# Patient Record
Sex: Male | Born: 1970 | Race: Black or African American | Hispanic: No | Marital: Single | Smoking: Current some day smoker
Health system: Southern US, Community
[De-identification: ages and names within clinical notes are randomized; demographics above are authoritative.]

## PROBLEM LIST (undated history)

## (undated) DIAGNOSIS — I10 Essential (primary) hypertension: Secondary | ICD-10-CM

## (undated) HISTORY — PX: GASTRIC BYPASS: SHX52

## (undated) HISTORY — PX: CHOLECYSTECTOMY: SHX55

---

## 2017-11-20 ENCOUNTER — Emergency Department (HOSPITAL_COMMUNITY)
Admission: EM | Admit: 2017-11-20 | Discharge: 2017-11-20 | Disposition: A | Discharge status: Home / Self Care | Payer: BLUE CROSS/BLUE SHIELD | Attending: Emergency Medicine | Admitting: Emergency Medicine

## 2017-11-20 ENCOUNTER — Emergency Department (HOSPITAL_COMMUNITY): Payer: BLUE CROSS/BLUE SHIELD

## 2017-11-20 ENCOUNTER — Encounter (HOSPITAL_COMMUNITY): Payer: Self-pay | Admitting: Emergency Medicine

## 2017-11-20 DIAGNOSIS — F17210 Nicotine dependence, cigarettes, uncomplicated: Secondary | ICD-10-CM | POA: Diagnosis not present

## 2017-11-20 DIAGNOSIS — I10 Essential (primary) hypertension: Secondary | ICD-10-CM | POA: Diagnosis not present

## 2017-11-20 DIAGNOSIS — F329 Major depressive disorder, single episode, unspecified: Secondary | ICD-10-CM | POA: Insufficient documentation

## 2017-11-20 DIAGNOSIS — F10239 Alcohol dependence with withdrawal, unspecified: Secondary | ICD-10-CM | POA: Insufficient documentation

## 2017-11-20 DIAGNOSIS — F32A Depression, unspecified: Secondary | ICD-10-CM

## 2017-11-20 DIAGNOSIS — F10230 Alcohol dependence with withdrawal, uncomplicated: Secondary | ICD-10-CM

## 2017-11-20 DIAGNOSIS — R0602 Shortness of breath: Secondary | ICD-10-CM | POA: Diagnosis present

## 2017-11-20 DIAGNOSIS — F10930 Alcohol use, unspecified with withdrawal, uncomplicated: Secondary | ICD-10-CM

## 2017-11-20 HISTORY — DX: Essential (primary) hypertension: I10

## 2017-11-20 LAB — RAPID URINE DRUG SCREEN, HOSP PERFORMED
AMPHETAMINES: NOT DETECTED
BENZODIAZEPINES: NOT DETECTED
Barbiturates: NOT DETECTED
COCAINE: NOT DETECTED
OPIATES: NOT DETECTED
Tetrahydrocannabinol: NOT DETECTED

## 2017-11-20 LAB — CBC
HEMATOCRIT: 44.5 % (ref 39.0–52.0)
HEMOGLOBIN: 14.5 g/dL (ref 13.0–17.0)
MCH: 28.5 pg (ref 26.0–34.0)
MCHC: 32.6 g/dL (ref 30.0–36.0)
MCV: 87.6 fL (ref 78.0–100.0)
Platelets: 199 10*3/uL (ref 150–400)
RBC: 5.08 MIL/uL (ref 4.22–5.81)
RDW: 14.1 % (ref 11.5–15.5)
WBC: 2.8 10*3/uL — ABNORMAL LOW (ref 4.0–10.5)

## 2017-11-20 LAB — BASIC METABOLIC PANEL
ANION GAP: 26 — AB (ref 5–15)
BUN: 13 mg/dL (ref 6–20)
CO2: 15 mmol/L — ABNORMAL LOW (ref 22–32)
Calcium: 9 mg/dL (ref 8.9–10.3)
Chloride: 98 mmol/L (ref 98–111)
Creatinine, Ser: 0.94 mg/dL (ref 0.61–1.24)
GLUCOSE: 74 mg/dL (ref 70–99)
POTASSIUM: 3.8 mmol/L (ref 3.5–5.1)
Sodium: 139 mmol/L (ref 135–145)

## 2017-11-20 LAB — I-STAT TROPONIN, ED: Troponin i, poc: 0 ng/mL (ref 0.00–0.08)

## 2017-11-20 MED ORDER — LORAZEPAM 1 MG PO TABS
2.0000 mg | ORAL_TABLET | Freq: Once | ORAL | Status: AC
  Administered 2017-11-20: 2 mg via ORAL
  Filled 2017-11-20: qty 2

## 2017-11-20 MED ORDER — LORAZEPAM 1 MG PO TABS: 1.0000 mg | ORAL_TABLET | Freq: Four times a day (QID) | ORAL | 0 refills | Status: DC | PRN

## 2017-11-20 MED ORDER — SODIUM CHLORIDE 0.9 % IV BOLUS
1000.0000 mL | Freq: Once | INTRAVENOUS | Status: AC
  Administered 2017-11-20: 1000 mL via INTRAVENOUS

## 2017-11-20 NOTE — ED Triage Notes (Signed)
Pt presents with c/o CP and SOB since this morning; pt also withdrawing from ETOH voluntarily, last drink last PM; pt reports 3 pints liquor daily

## 2017-11-20 NOTE — ED Provider Notes (Signed)
Patient placed in Quick Look pathway, seen and evaluated   Chief Complaint: alcohol withdrawal  HPI:  Pt presents with c/o CP and SOB since this morning; pt also withdrawing from ETOH voluntarily, last drink last PM; pt reports 3 pints liquor daily   ROS: CV: chest pain  Resp: shortness of breath  Neuro: shakey  Physical Exam:  BP (!) 142/102   Pulse (!) 119   Temp 99.2 F (37.3 C) (Oral)   Resp 20   Ht 5\' 10"  (1.778 m)   Wt 86.2 kg   SpO2 98%   BMI 27.26 kg/m    Gen: No distress  Neuro: not speaking, hands are shaking  Heart: tachycardia    Initiation of care has begun. The patient has been counseled on the process, plan, and necessity for staying for the completion/evaluation, and the remainder of the medical screening examination    Janne Napoleon, NP 11/20/17 Delrae Alfred, MD 11/20/17 [id number]
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
## 2017-11-20 NOTE — ED Notes (Signed)
ED Provider at bedside. 

## 2017-11-20 NOTE — Discharge Instructions (Signed)
Continue to avoid all forms of alcohol and consider going to Alcoholics Anonymous.  We are prescribing a medication that can help you avoid the symptoms of alcohol withdrawal lorazepam.  Return here if needed for problems.

## 2017-11-20 NOTE — ED Notes (Signed)
Pt confirmed family is able to take him home

## 2018-01-14 ENCOUNTER — Other Ambulatory Visit: Payer: Self-pay

## 2018-01-14 ENCOUNTER — Observation Stay (HOSPITAL_COMMUNITY)
Admission: EM | Admit: 2018-01-14 | Discharge: 2018-01-16 | Disposition: A | Discharge status: Home / Self Care | Payer: BLUE CROSS/BLUE SHIELD | Attending: Family Medicine | Admitting: Family Medicine

## 2018-01-14 ENCOUNTER — Encounter (HOSPITAL_COMMUNITY): Payer: Self-pay

## 2018-01-14 DIAGNOSIS — F10232 Alcohol dependence with withdrawal with perceptual disturbance: Principal | ICD-10-CM | POA: Insufficient documentation

## 2018-01-14 DIAGNOSIS — E876 Hypokalemia: Secondary | ICD-10-CM

## 2018-01-14 DIAGNOSIS — D72819 Decreased white blood cell count, unspecified: Secondary | ICD-10-CM | POA: Insufficient documentation

## 2018-01-14 DIAGNOSIS — K760 Fatty (change of) liver, not elsewhere classified: Secondary | ICD-10-CM | POA: Insufficient documentation

## 2018-01-14 DIAGNOSIS — F10231 Alcohol dependence with withdrawal delirium: Secondary | ICD-10-CM | POA: Diagnosis not present

## 2018-01-14 DIAGNOSIS — F10239 Alcohol dependence with withdrawal, unspecified: Secondary | ICD-10-CM | POA: Diagnosis present

## 2018-01-14 DIAGNOSIS — K219 Gastro-esophageal reflux disease without esophagitis: Secondary | ICD-10-CM | POA: Insufficient documentation

## 2018-01-14 DIAGNOSIS — Z79899 Other long term (current) drug therapy: Secondary | ICD-10-CM | POA: Insufficient documentation

## 2018-01-14 DIAGNOSIS — F17290 Nicotine dependence, other tobacco product, uncomplicated: Secondary | ICD-10-CM | POA: Insufficient documentation

## 2018-01-14 DIAGNOSIS — F17200 Nicotine dependence, unspecified, uncomplicated: Secondary | ICD-10-CM | POA: Diagnosis not present

## 2018-01-14 DIAGNOSIS — Z9109 Other allergy status, other than to drugs and biological substances: Secondary | ICD-10-CM | POA: Insufficient documentation

## 2018-01-14 DIAGNOSIS — I10 Essential (primary) hypertension: Secondary | ICD-10-CM

## 2018-01-14 DIAGNOSIS — Z9049 Acquired absence of other specified parts of digestive tract: Secondary | ICD-10-CM | POA: Insufficient documentation

## 2018-01-14 DIAGNOSIS — Z9884 Bariatric surgery status: Secondary | ICD-10-CM | POA: Diagnosis not present

## 2018-01-14 DIAGNOSIS — R748 Abnormal levels of other serum enzymes: Secondary | ICD-10-CM | POA: Diagnosis present

## 2018-01-14 DIAGNOSIS — D649 Anemia, unspecified: Secondary | ICD-10-CM | POA: Insufficient documentation

## 2018-01-14 DIAGNOSIS — K5900 Constipation, unspecified: Secondary | ICD-10-CM | POA: Insufficient documentation

## 2018-01-14 DIAGNOSIS — F419 Anxiety disorder, unspecified: Secondary | ICD-10-CM | POA: Diagnosis not present

## 2018-01-14 DIAGNOSIS — R7989 Other specified abnormal findings of blood chemistry: Secondary | ICD-10-CM

## 2018-01-14 DIAGNOSIS — F10939 Alcohol use, unspecified with withdrawal, unspecified: Secondary | ICD-10-CM | POA: Diagnosis present

## 2018-01-14 DIAGNOSIS — Z885 Allergy status to narcotic agent status: Secondary | ICD-10-CM | POA: Insufficient documentation

## 2018-01-14 DIAGNOSIS — K921 Melena: Secondary | ICD-10-CM | POA: Diagnosis not present

## 2018-01-14 DIAGNOSIS — R945 Abnormal results of liver function studies: Secondary | ICD-10-CM

## 2018-01-14 DIAGNOSIS — F10932 Alcohol use, unspecified with withdrawal with perceptual disturbance: Secondary | ICD-10-CM

## 2018-01-14 LAB — URINALYSIS, ROUTINE W REFLEX MICROSCOPIC
Bacteria, UA: NONE SEEN
Glucose, UA: NEGATIVE mg/dL
Hgb urine dipstick: NEGATIVE
KETONES UR: 80 mg/dL — AB
LEUKOCYTES UA: NEGATIVE
Nitrite: NEGATIVE
PH: 5 (ref 5.0–8.0)
Protein, ur: 100 mg/dL — AB
SPECIFIC GRAVITY, URINE: 1.033 — AB (ref 1.005–1.030)

## 2018-01-14 LAB — COMPREHENSIVE METABOLIC PANEL
ALBUMIN: 4.8 g/dL (ref 3.5–5.0)
ALT: 118 U/L — AB (ref 0–44)
AST: 188 U/L — ABNORMAL HIGH (ref 15–41)
Alkaline Phosphatase: 52 U/L (ref 38–126)
Anion gap: 13 (ref 5–15)
BUN: 11 mg/dL (ref 6–20)
CHLORIDE: 99 mmol/L (ref 98–111)
CO2: 27 mmol/L (ref 22–32)
Calcium: 9.4 mg/dL (ref 8.9–10.3)
Creatinine, Ser: 0.77 mg/dL (ref 0.61–1.24)
GFR calc Af Amer: >60 mL/min (ref >60)
GFR calc non Af Amer: >60 mL/min (ref >60)
GLUCOSE: 101 mg/dL — AB (ref 70–99)
POTASSIUM: 3.2 mmol/L — AB (ref 3.5–5.1)
Sodium: 139 mmol/L (ref 135–145)
Total Bilirubin: 5.6 mg/dL — ABNORMAL HIGH (ref 0.3–1.2)
Total Protein: 8.1 g/dL (ref 6.5–8.1)

## 2018-01-14 LAB — CBC
HEMATOCRIT: 41.2 % (ref 39.0–52.0)
HEMOGLOBIN: 13.6 g/dL (ref 13.0–17.0)
MCH: 29.1 pg (ref 26.0–34.0)
MCHC: 33 g/dL (ref 30.0–36.0)
MCV: 88.2 fL (ref 80.0–100.0)
Platelets: 134 10*3/uL — ABNORMAL LOW (ref 150–400)
RBC: 4.67 MIL/uL (ref 4.22–5.81)
RDW: 14.6 % (ref 11.5–15.5)
WBC: 2.6 10*3/uL — ABNORMAL LOW (ref 4.0–10.5)
nRBC: 0 % (ref 0.0–0.2)

## 2018-01-14 LAB — RAPID URINE DRUG SCREEN, HOSP PERFORMED
AMPHETAMINES: NOT DETECTED
BARBITURATES: NOT DETECTED
Benzodiazepines: NOT DETECTED
Cocaine: NOT DETECTED
Opiates: NOT DETECTED
TETRAHYDROCANNABINOL: NOT DETECTED

## 2018-01-14 LAB — APTT: aPTT: 30 seconds (ref 24–36)

## 2018-01-14 LAB — AMMONIA: Ammonia: 29 umol/L (ref 9–35)

## 2018-01-14 LAB — MRSA PCR SCREENING: MRSA BY PCR: NEGATIVE

## 2018-01-14 LAB — ETHANOL: Alcohol, Ethyl (B): <10 mg/dL (ref <10)

## 2018-01-14 LAB — PROTIME-INR
INR: 1.04
PROTHROMBIN TIME: 13.5 s (ref 11.4–15.2)

## 2018-01-14 LAB — LIPASE, BLOOD: LIPASE: 34 U/L (ref 11–51)

## 2018-01-14 MED ORDER — AMLODIPINE BESYLATE 10 MG PO TABS
10.0000 mg | ORAL_TABLET | Freq: Every day | ORAL | Status: DC
  Administered 2018-01-15 – 2018-01-16 (×2): 10 mg via ORAL
  Filled 2018-01-14 (×2): qty 1

## 2018-01-14 MED ORDER — DOCUSATE SODIUM 100 MG PO CAPS
100.0000 mg | ORAL_CAPSULE | Freq: Two times a day (BID) | ORAL | Status: DC
  Administered 2018-01-15 – 2018-01-16 (×2): 100 mg via ORAL
  Filled 2018-01-14 (×2): qty 1

## 2018-01-14 MED ORDER — SODIUM CHLORIDE 0.9 % IV BOLUS
1000.0000 mL | Freq: Once | INTRAVENOUS | Status: AC
  Administered 2018-01-14: 1000 mL via INTRAVENOUS

## 2018-01-14 MED ORDER — LORAZEPAM 2 MG/ML IJ SOLN
1.0000 mg | Freq: Once | INTRAMUSCULAR | Status: AC
  Administered 2018-01-14: 1 mg via INTRAVENOUS
  Filled 2018-01-14: qty 1

## 2018-01-14 MED ORDER — SODIUM CHLORIDE 0.9 % IV SOLN
INTRAVENOUS | Status: DC
  Administered 2018-01-14 – 2018-01-16 (×4): via INTRAVENOUS

## 2018-01-14 MED ORDER — THIAMINE HCL 100 MG/ML IJ SOLN
100.0000 mg | Freq: Every day | INTRAMUSCULAR | Status: DC
  Administered 2018-01-14 – 2018-01-16 (×3): 100 mg via INTRAVENOUS
  Filled 2018-01-14 (×3): qty 2

## 2018-01-14 MED ORDER — ONDANSETRON HCL 4 MG/2ML IJ SOLN: 4.0000 mg | Freq: Four times a day (QID) | INTRAMUSCULAR | Status: DC | PRN

## 2018-01-14 MED ORDER — ONDANSETRON HCL 4 MG/2ML IJ SOLN
4.0000 mg | Freq: Once | INTRAMUSCULAR | Status: AC
  Administered 2018-01-14: 4 mg via INTRAVENOUS
  Filled 2018-01-14: qty 2

## 2018-01-14 MED ORDER — LORAZEPAM 2 MG/ML IJ SOLN
1.0000 mg | Freq: Once | INTRAMUSCULAR | Status: AC
  Administered 2018-01-14: 1 mg via INTRAVENOUS

## 2018-01-14 MED ORDER — THIAMINE HCL 100 MG/ML IJ SOLN
100.0000 mg | Freq: Once | INTRAMUSCULAR | Status: AC
  Administered 2018-01-14: 100 mg via INTRAVENOUS
  Filled 2018-01-14: qty 2

## 2018-01-14 MED ORDER — FOLIC ACID 1 MG PO TABS
1.0000 mg | ORAL_TABLET | Freq: Every day | ORAL | Status: DC
  Administered 2018-01-14 – 2018-01-16 (×3): 1 mg via ORAL
  Filled 2018-01-14 (×3): qty 1

## 2018-01-14 MED ORDER — POTASSIUM CHLORIDE CRYS ER 20 MEQ PO TBCR
40.0000 meq | EXTENDED_RELEASE_TABLET | Freq: Once | ORAL | Status: AC
  Administered 2018-01-14: 40 meq via ORAL
  Filled 2018-01-14: qty 2

## 2018-01-14 MED ORDER — ADULT MULTIVITAMIN W/MINERALS CH
1.0000 | ORAL_TABLET | Freq: Once | ORAL | Status: AC
  Administered 2018-01-14: 1 via ORAL
  Filled 2018-01-14: qty 1

## 2018-01-14 MED ORDER — HALOPERIDOL LACTATE 5 MG/ML IJ SOLN
5.0000 mg | Freq: Once | INTRAMUSCULAR | Status: AC
  Administered 2018-01-14: 5 mg via INTRAVENOUS
  Filled 2018-01-14: qty 1

## 2018-01-14 MED ORDER — ONDANSETRON HCL 4 MG PO TABS: 4.0000 mg | ORAL_TABLET | Freq: Four times a day (QID) | ORAL | Status: DC | PRN

## 2018-01-14 MED ORDER — LORAZEPAM 2 MG/ML IJ SOLN
2.0000 mg | INTRAMUSCULAR | Status: DC | PRN
  Administered 2018-01-14: 3 mg via INTRAVENOUS
  Administered 2018-01-14: 2 mg via INTRAVENOUS
  Administered 2018-01-14 – 2018-01-15 (×2): 3 mg via INTRAVENOUS
  Administered 2018-01-16: 2 mg via INTRAVENOUS
  Filled 2018-01-14: qty 1
  Filled 2018-01-14: qty 2
  Filled 2018-01-14: qty 1
  Filled 2018-01-14 (×2): qty 2

## 2018-01-14 MED ORDER — ENSURE ENLIVE PO LIQD
237.0000 mL | Freq: Two times a day (BID) | ORAL | Status: DC
  Administered 2018-01-15 – 2018-01-16 (×3): 237 mL via ORAL

## 2018-01-14 MED ORDER — MUSCLE RUB 10-15 % EX CREA
1.0000 "application " | TOPICAL_CREAM | CUTANEOUS | Status: DC | PRN
  Filled 2018-01-14: qty 85

## 2018-01-14 NOTE — ED Notes (Signed)
Pt given food per order.

## 2018-01-14 NOTE — H&P (Addendum)
History and Physical  [name] ZOX:[id number] DOB: 1970-12-23 DOA: 01/14/2018  PCP: Patient, No Pcp Per Patient coming from: Home  I have personally briefly reviewed patient's old medical records in [facility]   Chief Complaint: Tremors, hallucination.  HPI: [name] is a 47 y.o. male with past medical history significant for hypertension, prior cholecystectomy, alcohol abuse who presents to the emergency department complaining of tremors, anxiety, visible on hallucinatory elucidation.  Reports that his symptoms are started on Saturday, 3 days prior to admission after he stopped drinking alcohol.  Last alcohol intake was Friday.  He has been drinking pint of alcohol every day for the last week.  Weeks prior he used to drink every other day, a pint.  He also reports a sensation of something crawling in his bed.  He is also complaining of abdominal cramps.  So bad that he has to stand up on a stretcher himself.  He denies nausea or vomiting. Denies chest pain, shortness of breath, cough.   Review of Systems: All systems reviewed and apart from history of presenting illness, are negative.  Past Medical History:  Diagnosis Date  . Hypertension    Past Surgical History:  Procedure Laterality Date  . CHOLECYSTECTOMY    . GASTRIC BYPASS     Social History:  reports that he has been smoking. He does not have any smokeless tobacco history on file. He reports that he drinks alcohol. He reports that he has current or past drug history.  He drinks a pint daily.   Allergies  Allergen Reactions  . Oxycodone Anaphylaxis  . Pollen Extract Shortness Of Breath, Itching and Other (See Comments)    Runny nose, itchy eyes, sniffles   Family history; his mother died of leukemia at age 20.  His father died in a motor vehicle accident at age 93  Prior to Admission medications   Medication Sig Start Date End Date Taking? Authorizing Provider  acetaminophen (TYLENOL) 325 MG  tablet Take 650 mg by mouth every 6 (six) hours as needed for mild pain or headache.   Yes [provider]  amLODipine (NORVASC) 10 MG tablet Take 10 mg by mouth daily.   Yes [provider]  ibuprofen (ADVIL,MOTRIN) 200 MG tablet Take 600-800 mg by mouth every 6 (six) hours as needed for headache or mild pain.   Yes [provider]  Menthol-Methyl Salicylate (MUSCLE RUB) 10-15 % CREA Apply 1 application topically as needed for muscle pain.   Yes [provider]  LORazepam (ATIVAN) 1 MG tablet Take 1 tablet (1 mg total) by mouth every 6 (six) hours as needed (Alcohol withdrawal symptoms). Patient not taking: Reported on 01/14/2018 11/20/17   Mancel Bale, MD   Physical Exam: Vitals:   01/14/18 1459 01/14/18 1500 01/14/18 1600 01/14/18 1700  BP: (!) 137/97 (!) 144/99 119/86 (!) 132/91  Pulse: (!) 106 (!) 103 (!) 109 (!) 120  Resp:  19 19 16   Temp:      TempSrc:      SpO2:  98% 96% 100%  Weight:      Height:         General exam: Moderately built and nourished patient, lying comfortably supine on the gurney in no obvious distress.  Anxious  Head, eyes and ENT: Nontraumatic and normocephalic. Pupils equally reacting to light and accommodation. Oral mucosa moist.  Neck: Supple. No JVD, carotid bruit or thyromegaly.  Lymphatics: No lymphadenopathy.  Respiratory system: Clear to auscultation.  No increased work of breathing.  Cardiovascular system: S1 and S2 heard, regular, tachycardic. No JVD, murmurs, gallops, clicks or pedal edema.  Gastrointestinal system: Abdomen is mild distended, soft and nontender. Normal bowel sounds heard.   Central nervous system: Alert and oriented. No focal neurological deficits.  Hands tremors  Extremities: Symmetric 5 x 5 power. Peripheral pulses symmetrically felt.   Skin: No rashes or acute findings.  Musculoskeletal system: Negative exam.  Psychiatry: Pleasant and cooperative.  He report visual and auditory  hallucination   Labs on Admission:  Basic Metabolic Panel: Recent Labs  Lab 01/14/18 1500  NA 139  K 3.2*  CL 99  CO2 27  GLUCOSE 101*  BUN 11  CREATININE 0.77  CALCIUM 9.4   Liver Function Tests: Recent Labs  Lab 01/14/18 1500  AST 188*  ALT 118*  ALKPHOS 52  BILITOT 5.6*  PROT 8.1  ALBUMIN 4.8   Recent Labs  Lab 01/14/18 1500  LIPASE 34   Recent Labs  Lab 01/14/18 1522  AMMONIA 29   CBC: Recent Labs  Lab 01/14/18 1500  WBC 2.6*  HGB 13.6  HCT 41.2  MCV 88.2  PLT 134*   Cardiac Enzymes: No results for input(s): CKTOTAL, CKMB, CKMBINDEX, TROPONINI in the last 168 hours.  BNP (last 3 results) No results for input(s): PROBNP in the last 8760 hours. CBG: No results for input(s): GLUCAP in the last 168 hours.  Radiological Exams on Admission: No results found.  EKG: No EKG available with ordered.  Assessment/Plan Principal Problem:   Alcohol withdrawal (HCC) Active Problems:   Abnormal transaminases   Essential hypertension   Hypokalemia   Leukopenia   #1-Alcohol withdrawal; concern for delirium tremens. Presents with confusion, hallucination, tremors. IV fluids. We will start CIWA protocol, Ativan as needed. Need to the stepdown unit for closer monitoring. Will start thiamine, folate.  #2-transaminases, hiperbilirubinemia; In the setting of alcohol abuse. No prior labs to compare. Check right upper quadrant ultrasound. Prior history of cholecystectomy. Repeat liver function test in the morning. Will need to get PTT, INR.  To calculate DF score to consider steroids.  #3-leukopenia; Suspect  related to alcohol abuse. Repeat labs in the morning.  #3 hypokalemia Repleted orally  #4-thrombocytopenia Setting of alcohol abuse. Follow trend.  #5-tachycardia;  Elated to alcohol withdrawal. EKG. IV fluids.  #6-hypertension; Continue with Norvasc.  DVT Prophylaxis: SCDs Code Status: Full code Family Communication: No family  at bedside. Disposition Plan: Admitted to the stepdown unit for alcohol withdrawal.  Time spent: 75 minutes    Alba CoryBelkys A Bentleigh Stankus MD [facility] Pager [phone]  If 7PM-7AM, please contact night-coverage [email or web address] Password [email or web address]  01/14/2018, 5:42 PM
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
## 2018-01-14 NOTE — ED Triage Notes (Signed)
Patient BIB EMS with complaints of ETOH withdrawal symptoms. Patient usually drinks hard liquor heavily x8-10 years. Patient reports his last drink was 2 days ago. Patient reports he recently moved to [place] to live with his daughter and just started a new job. Patient [place] detox community support options in [place]. Patient reports nausea/vomiting/ cold sweats/hot flashes/ night terrors/ auditory hallucinations. Patient denies SI/HI.

## 2018-01-15 ENCOUNTER — Encounter (HOSPITAL_COMMUNITY): Payer: Self-pay

## 2018-01-15 ENCOUNTER — Observation Stay (HOSPITAL_COMMUNITY): Payer: BLUE CROSS/BLUE SHIELD

## 2018-01-15 DIAGNOSIS — I10 Essential (primary) hypertension: Secondary | ICD-10-CM | POA: Diagnosis not present

## 2018-01-15 DIAGNOSIS — E876 Hypokalemia: Secondary | ICD-10-CM | POA: Diagnosis not present

## 2018-01-15 DIAGNOSIS — F10231 Alcohol dependence with withdrawal delirium: Secondary | ICD-10-CM | POA: Diagnosis not present

## 2018-01-15 DIAGNOSIS — R748 Abnormal levels of other serum enzymes: Secondary | ICD-10-CM | POA: Diagnosis not present

## 2018-01-15 LAB — CBC
HCT: 36.7 % — ABNORMAL LOW (ref 39.0–52.0)
HEMATOCRIT: 35.3 % — AB (ref 39.0–52.0)
HEMOGLOBIN: 11.8 g/dL — AB (ref 13.0–17.0)
Hemoglobin: 11.5 g/dL — ABNORMAL LOW (ref 13.0–17.0)
MCH: 29.5 pg (ref 26.0–34.0)
MCH: 29.3 pg (ref 26.0–34.0)
MCHC: 32.6 g/dL (ref 30.0–36.0)
MCHC: 32.2 g/dL (ref 30.0–36.0)
MCV: 90.5 fL (ref 80.0–100.0)
MCV: 91.1 fL (ref 80.0–100.0)
NRBC: 0 % (ref 0.0–0.2)
PLATELETS: 99 10*3/uL — AB (ref 150–400)
Platelets: 104 10*3/uL — ABNORMAL LOW (ref 150–400)
RBC: 3.9 MIL/uL — AB (ref 4.22–5.81)
RBC: 4.03 MIL/uL — ABNORMAL LOW (ref 4.22–5.81)
RDW: 14.6 % (ref 11.5–15.5)
RDW: 14.3 % (ref 11.5–15.5)
WBC: 2 10*3/uL — AB (ref 4.0–10.5)
WBC: 2.4 10*3/uL — AB (ref 4.0–10.5)
nRBC: 0 % (ref 0.0–0.2)

## 2018-01-15 LAB — COMPREHENSIVE METABOLIC PANEL
ALT: 135 U/L — AB (ref 0–44)
AST: 220 U/L — ABNORMAL HIGH (ref 15–41)
Albumin: 3.9 g/dL (ref 3.5–5.0)
Alkaline Phosphatase: 41 U/L (ref 38–126)
Anion gap: 7 (ref 5–15)
BUN: 6 mg/dL (ref 6–20)
CHLORIDE: 106 mmol/L (ref 98–111)
CO2: 28 mmol/L (ref 22–32)
CREATININE: 0.63 mg/dL (ref 0.61–1.24)
Calcium: 8.6 mg/dL — ABNORMAL LOW (ref 8.9–10.3)
Glucose, Bld: 84 mg/dL (ref 70–99)
Potassium: 3.4 mmol/L — ABNORMAL LOW (ref 3.5–5.1)
SODIUM: 141 mmol/L (ref 135–145)
Total Bilirubin: 3.7 mg/dL — ABNORMAL HIGH (ref 0.3–1.2)
Total Protein: 6.5 g/dL (ref 6.5–8.1)

## 2018-01-15 LAB — PROTIME-INR
INR: 1.02
Prothrombin Time: 13.3 seconds (ref 11.4–15.2)

## 2018-01-15 LAB — HIV ANTIBODY (ROUTINE TESTING W REFLEX): HIV SCREEN 4TH GENERATION: NONREACTIVE

## 2018-01-15 MED ORDER — POTASSIUM CHLORIDE CRYS ER 20 MEQ PO TBCR
40.0000 meq | EXTENDED_RELEASE_TABLET | Freq: Once | ORAL | Status: AC
  Administered 2018-01-15: 40 meq via ORAL
  Filled 2018-01-15: qty 2

## 2018-01-15 MED ORDER — HYDRALAZINE HCL 25 MG PO TABS: 25.0000 mg | ORAL_TABLET | Freq: Four times a day (QID) | ORAL | Status: DC | PRN

## 2018-01-15 NOTE — Progress Notes (Signed)
   01/15/18 1200  Clinical Encounter Type  Visited With Patient  Visit Type Initial;Psychological support;Spiritual support;Critical Care  Referral From Nurse  Consult/Referral To Chaplain  Spiritual Encounters  Spiritual Needs Other (Comment) (Advance Directive )  Stress Factors  Patient Stress Factors Health changes;Major life changes  Advance Directives (For Healthcare)  Does Patient Have a Medical Advance Directive? No  Would patient like information on creating a medical advance directive? No - Patient declined (Not at this time )   Provided patient with Advance Directive paperwork. He does not want to complete at this time. Main concern is being discharged for Thanksgiving.

## 2018-01-15 NOTE — Progress Notes (Signed)
[facility]  PROGRESS NOTE  [name] WGN:[id number] DOB: 11/10/1970 DOA: 01/14/2018 PCP: Patient, No Pcp Per   Brief HPI:   47 year old male with a history of hypertension, prior cholecystectomy, alcohol abuse came to ED with complaints of tremors, anxiety hallucinations.  Patient had stopped drinking alcohol 3 days prior to admission.  Patient admitted for alcohol withdrawal.    Subjective   Patient seen and examined still having tremors.  Required 8 mg of Ativan last night along with 5 mg of Haldol for agitation and confusion.   Assessment/Plan:     1. Alcohol withdrawal-patient is at risk for DTs.  Continue CIWA protocol.  Patient received 8 mg of Ativan last night along with 5 mg of Haldol.  Continue to monitor in stepdown unit.  2. Elevated LFTs-patient has mild elevation of LFTs and bilirubin.  Likely in setting of alcohol abuse.  He has had a history of prior cholecystectomy.  LFTs are stable at this time.  Check CMP in a.m.  3. Leukopenia-WBC is 2.0 which is down from 2.6 yesterday.  Platelet count is also down to 99,000.  Will follow CBC in a.m.  Likely in setting of alcohol abuse.  4. Hypertension-blood pressure stable, continue Norvasc.  Also continue with hydralazine 25 mg p.o. every 6 hours PRN for BP greater than 160/100.  5. Hypokalemia-potassium is 3.4, will replace potassium and check BMP in am.     CBG: No results for input(s): GLUCAP in the last 168 hours.  CBC: Recent Labs  Lab 01/14/18 1500 01/15/18 0322  WBC 2.6* 2.0*  HGB 13.6 11.5*  HCT 41.2 35.3*  MCV 88.2 90.5  PLT 134* 99*    Basic Metabolic Panel: Recent Labs  Lab 01/14/18 1500 01/15/18 0322  NA 139 141  K 3.2* 3.4*  CL 99 106  CO2 27 28  GLUCOSE 101* 84  BUN 11 6  CREATININE 0.77 0.63  CALCIUM 9.4 8.6*     DVT prophylaxis: SCDs  Code Status: Full code  Family Communication: No family at bedside  Disposition Plan: likely home when medically ready for  discharge   Consultants:    Procedures:     Antibiotics:   Anti-infectives (From admission, onward)   None       Objective   Vitals:   01/15/18 1000 01/15/18 1018 01/15/18 1100 01/15/18 1200  BP: (!) 155/96 118/81 125/89 (!) 150/97  Pulse: 87  99 (!) 115  Resp: 18  18 17   Temp:    98.3 F (36.8 C)  TempSrc:    Oral  SpO2: 97%  99% 100%  Weight:      Height:        Intake/Output Summary (Last 24 hours) at 01/15/2018 1330 Last data filed at 01/15/2018 1200 Gross per 24 hour  Intake 2114.16 ml  Output 1600 ml  Net 514.16 ml   Filed Weights   01/14/18 1425 01/14/18 1428  Weight: 86.2 kg 80.7 kg     Physical Examination:    General: Appears mildly anxious  Cardiovascular: S1-S2, regular, no murmurs auscultated  Respiratory: Clear to auscultation bilaterally  Abdomen: Soft, nontender, no organomegaly  Extremities: Tremors noted in the hands  Neurologic: Alert, oriented x3, no focal deficit noted     Data Reviewed: I have personally reviewed following labs and imaging studies   Recent Results (from the past 240 hour(s))  MRSA PCR Screening     Status: None   Collection Time: 01/14/18  6:57 PM  Result Value  Ref Range Status   MRSA by PCR NEGATIVE NEGATIVE Final    Comment:        The GeneXpert MRSA Assay (FDA approved for NASAL specimens only), is one component of a comprehensive MRSA colonization surveillance program. It is not intended to diagnose MRSA infection nor to guide or monitor treatment for MRSA infections. Performed at [facility], [address] [place], [place] [address]      Liver Function Tests: Recent Labs  Lab 01/14/18 1500 01/15/18 0322  AST 188* 220*  ALT 118* 135*  ALKPHOS 52 41  BILITOT 5.6* 3.7*  PROT 8.1 6.5  ALBUMIN 4.8 3.9   Recent Labs  Lab 01/14/18 1500  LIPASE 34   Recent Labs  Lab 01/14/18 1522  AMMONIA 29    Cardiac Enzymes: No results for input(s): CKTOTAL,  CKMB, CKMBINDEX, TROPONINI in the last 168 hours. BNP (last 3 results) No results for input(s): BNP in the last 8760 hours.  ProBNP (last 3 results) No results for input(s): PROBNP in the last 8760 hours.    Studies: [place] Abdomen Limited Ruq  Result Date: 01/15/2018 CLINICAL DATA:  Abnormal LFTs EXAM: ULTRASOUND ABDOMEN LIMITED RIGHT UPPER QUADRANT COMPARISON:  None. FINDINGS: Gallbladder: Prior cholecystectomy Common bile duct: Diameter: Normal caliber, 6 mm Liver: Increased echotexture compatible with fatty infiltration. No focal abnormality or biliary ductal dilatation. Portal vein is patent on color Doppler imaging with normal direction of blood flow towards the liver. IMPRESSION: Fatty infiltration of the liver. Prior cholecystectomy. No acute findings. Electronically Signed   By: Charlett NoseKevin  Dover M.D.   On: 01/15/2018 09:24    Scheduled Meds: . amLODipine  10 mg Oral Daily  . docusate sodium  100 mg Oral BID  . feeding supplement (ENSURE ENLIVE)  237 mL Oral BID BM  . folic acid  1 mg Oral Daily  . thiamine injection  100 mg Intravenous Daily    Admission status: Observation  Time spent: 25 min  Meredeth IdeGagan S Terran Klinke   [facility] Pager [phone]. If 7PM-7AM, please contact night-coverage at [email or web address], Office  [phone]  password [email or web address]  01/15/2018, 1:30 PM  LOS: 0 days

## 2018-01-15 NOTE — Progress Notes (Signed)
MD made aware that patient had BM coated in blood. Per patient, no previous GI problems. New orders for CBC placed by MD. Will continue to monitor.

## 2018-01-15 NOTE — Progress Notes (Signed)
Initial Nutrition Assessment  DOCUMENTATION CODES:   (Will assess for malnutrition at follow-up)  INTERVENTION:  - Continue Ensure Enlive po BID, each supplement provides 350 kcal and 20 grams of protein - Continue to encourage PO intakes.  - Will monitor for additional nutrition-related needs.   NUTRITION DIAGNOSIS:   Inadequate oral intake related to acute illness(alcohol withdrawal) as evidenced by per patient/family report.  GOAL:   Patient will meet greater than or equal to 90% of their needs  MONITOR:   PO intake, Supplement acceptance, Weight trends, Labs  REASON FOR ASSESSMENT:   Malnutrition Screening Tool  ASSESSMENT:   47 y.o. male with past medical history significant for HTN, prior cholecystectomy, and alcohol abuse. He presented to the ED with complaints of tremors, anxiety, visible and tactile hallucinations. He reported his symptoms started on 11/23 and last alcoholic drink was on 11/22. He has been drinking pint of alcohol every day for the last week. Weeks prior he was drinking a pint every other day. He reports a sensation of something crawling in his bed. Also reports abdominal cramps, denies N/V, chest pain, or SOB.  BMI indicates overweight status. No intakes documented since admission. Patient had not yet ordered breakfast as he reported difficulty keeping his eyes open to look at the menu. Patient needed to be awakened multiple times during RD visit and only able to discern that patient has been eating since symptoms started on 11/23 but unable to obtain further details surrounding this.   Patient also reported UBW of 205 lb and that he last weighed this 1.5-2 months ago at a doctor's appointment. Current weight is 178 lb. Per review of Care Everywhere, [facility] on 05/09/17.   Unable to obtain further information at this time. Ordered breakfast per patient preference: wheat toast with butter,  scrambled eggs with cheese, sausage, grapes, and coffee with Splenda.    Medications reviewed; 100 mg Colace BID, 1 mg Folvite/day, daily multivitamin with minerals, 40 mEq K-Dur x1 dose today, 100 mg IV thiamine/day. Labs reviewed; K: 3.4 mmol/L, Ca: 8.6 mg/dL, LFTs elevated. IVF; NS @ 100 mL/hr.      NUTRITION - FOCUSED PHYSICAL EXAM:  Unable to obtain consent from patient. Will attempt at follow-up. No visible wasting.   Diet Order:   Diet Order            Diet regular Room service appropriate? Yes; Fluid consistency: Thin  Diet effective now              EDUCATION NEEDS:   No education needs have been identified at this time  Skin:  Skin Assessment: Reviewed RN Assessment  Last BM:  PTA/unknown  Height:   Ht Readings from Last 1 Encounters:  01/14/18 5\' 10"  (1.778 m)    Weight:   Wt Readings from Last 1 Encounters:  01/14/18 80.7 kg    Ideal Body Weight:  75.45 kg  BMI:  Body mass index is 25.54 kg/m.  Estimated Nutritional Needs:   Kcal:  2015-2220 kcal  Protein:  110-120 grams  Fluid:  >/= 2.2 L/day     Trenton GammonJessica Kaisa Wofford, MS, RD, LDN, [facility] Inpatient Clinical Dietitian Pager # [phone] After hours/weekend pager # [phone]

## 2018-01-15 NOTE — Care Management Note (Signed)
Case Management Note  Patient Details  Name: Loleta BooksJustin L Shinsky MRN: [id number] Date of Birth: 06/26/1970  Subjective/Objective:                  47 y.o. male with past medical history significant for hypertension, prior cholecystectomy, alcohol abuse who presents to the emergency department complaining of tremors, anxiety, visible on hallucinatory elucidation.  Reports that his symptoms are started on Saturday, 3 days prior to admission after he stopped drinking alcohol.  Last alcohol intake was Friday.  He has been drinking pint of alcohol every day for the last week.  Weeks prior he used to drink every other day, a pint.  He also reports a sensation of something crawling in his bed.  He is also complaining of abdominal cramps.  So bad that he has to stand up on a stretcher himself.  He denies nausea or vomiting. Denies chest pain, shortness of breath, cough.  Action/Plan: Following for progression of care. Following for cm needs, none present at this time, no discharge plans at this time.  Expected Discharge Date:                  Expected Discharge Plan:  Home/Self Care  In-House Referral:  Clinical Social Work  Discharge planning Services  CM Consult  Post Acute Care Choice:    Choice offered to:     DME Arranged:    DME Agency:     HH Arranged:    HH Agency:     Status of Service:  In process, will continue to follow  If discussed at Long Length of Stay Meetings, dates discussed:    Additional Comments:  Golda AcreDavis, Rhonda Lynn, RN 01/15/2018, 9:34 AM

## 2018-01-16 ENCOUNTER — Ambulatory Visit (HOSPITAL_COMMUNITY): Payer: BLUE CROSS/BLUE SHIELD

## 2018-01-16 DIAGNOSIS — K625 Hemorrhage of anus and rectum: Secondary | ICD-10-CM | POA: Diagnosis not present

## 2018-01-16 DIAGNOSIS — R945 Abnormal results of liver function studies: Secondary | ICD-10-CM | POA: Diagnosis not present

## 2018-01-16 DIAGNOSIS — R748 Abnormal levels of other serum enzymes: Secondary | ICD-10-CM

## 2018-01-16 DIAGNOSIS — F10231 Alcohol dependence with withdrawal delirium: Secondary | ICD-10-CM

## 2018-01-16 LAB — CBC
HEMATOCRIT: 36.6 % — AB (ref 39.0–52.0)
Hemoglobin: 11.7 g/dL — ABNORMAL LOW (ref 13.0–17.0)
MCH: 28.7 pg (ref 26.0–34.0)
MCHC: 32 g/dL (ref 30.0–36.0)
MCV: 89.9 fL (ref 80.0–100.0)
NRBC: 0 % (ref 0.0–0.2)
PLATELETS: 93 10*3/uL — AB (ref 150–400)
RBC: 4.07 MIL/uL — ABNORMAL LOW (ref 4.22–5.81)
RDW: 14.4 % (ref 11.5–15.5)
WBC: 2.6 10*3/uL — AB (ref 4.0–10.5)

## 2018-01-16 LAB — COMPREHENSIVE METABOLIC PANEL
ALK PHOS: 43 U/L (ref 38–126)
ALT: 257 U/L — AB (ref 0–44)
AST: 328 U/L — AB (ref 15–41)
Albumin: 4 g/dL (ref 3.5–5.0)
Anion gap: 6 (ref 5–15)
BILIRUBIN TOTAL: 2.1 mg/dL — AB (ref 0.3–1.2)
BUN: 9 mg/dL (ref 6–20)
CALCIUM: 8.9 mg/dL (ref 8.9–10.3)
CHLORIDE: 107 mmol/L (ref 98–111)
CO2: 27 mmol/L (ref 22–32)
CREATININE: 0.69 mg/dL (ref 0.61–1.24)
Glucose, Bld: 75 mg/dL (ref 70–99)
Potassium: 3.5 mmol/L (ref 3.5–5.1)
Sodium: 140 mmol/L (ref 135–145)
Total Protein: 6.6 g/dL (ref 6.5–8.1)

## 2018-01-16 LAB — ACETAMINOPHEN LEVEL: Acetaminophen (Tylenol), Serum: <10 ug/mL — ABNORMAL LOW (ref 10–30)

## 2018-01-16 MED ORDER — FOLIC ACID 1 MG PO TABS: 1.0000 mg | ORAL_TABLET | Freq: Every day | ORAL | 0 refills | Status: AC

## 2018-01-16 MED ORDER — OMEPRAZOLE MAGNESIUM 20 MG PO TBEC: 20.0000 mg | DELAYED_RELEASE_TABLET | Freq: Every day | ORAL | 1 refills | Status: DC

## 2018-01-16 MED ORDER — DOCUSATE SODIUM 100 MG PO CAPS: 100.0000 mg | ORAL_CAPSULE | Freq: Two times a day (BID) | ORAL | 0 refills | Status: DC

## 2018-01-16 MED ORDER — IBUPROFEN 800 MG PO TABS
800.0000 mg | ORAL_TABLET | Freq: Once | ORAL | Status: AC
  Administered 2018-01-16: 800 mg via ORAL
  Filled 2018-01-16: qty 1

## 2018-01-16 MED ORDER — VITAMIN B-1 100 MG PO TABS: 100.0000 mg | ORAL_TABLET | Freq: Every day | ORAL | Status: DC

## 2018-01-16 MED ORDER — HYDROCORTISONE ACETATE 25 MG RE SUPP
25.0000 mg | Freq: Two times a day (BID) | RECTAL | Status: DC
  Filled 2018-01-16: qty 1

## 2018-01-16 MED ORDER — THIAMINE HCL 100 MG PO TABS: 100.0000 mg | ORAL_TABLET | Freq: Every day | ORAL | 0 refills | Status: AC

## 2018-01-16 MED ORDER — HYDROCORTISONE ACETATE 25 MG RE SUPP: 25.0000 mg | Freq: Two times a day (BID) | RECTAL | 0 refills | Status: DC

## 2018-01-16 NOTE — Progress Notes (Signed)
Referring Provider: [facility]   Primary Care Physician:  Patient, No Pcp Per Primary Gastroenterologist: unassigned     Reason for Consultation:  Blood in stool   ASSESSMENT  / PLAN:    46. 47 yo male with ETOH abuse / admitted with ETOH withdrawal. Withdrawal symptoms improving. Getting Benzos as needed.   2. Blood in stool. He has had lower abdominal cramping for a few days but suspicion for ischemia is low. Stool itself if brown (saw in toilet today). Suspect bleeding is hemorrhoidal.  -Patient should get colonoscopy at some point. This is okay today as outpatient unless ongoing blood loss during this admission (only scant amount of BRB with a brown stool this am).  -Since we think this may be hemorrhoidal will start Anusol HC cream BID x 7 days.  -Patient says he is occasionally constipated depending on diet. Would start daily Benefiber.   3. Mild [place] anemia. Reviewed [place] EMR labs on patient's phone. One month ago his hgb was 13.4, ferritin 128,  TIBC 349, iron sat 33%, B12 and folate normal. Platelets 180. His presenting hgb this admission was at baseline (13.6). It declined overnight to 11.8. Although he had a bloody stool the decline in hgb seems out of proportion. He has been getting IVF so suspect some of the drop is dilutional.   4. Abnormal liver tests. In June of this year his liver tests were mildly elevated in pattern  c/w ETOH liver disease (My Chart on his phone). At that time: Tbili was 1.6, AP 55, AST 103, AST 82.  Liver tests this admission don't quite fit ETOH hepatitis however as his bilirubin is coming down / transaminases still rising. No stigmata of chronic liver disease on exam.   -No signs of acute liver failure. INR normal. Bili improving.  -will send viral hepatitis studies, tylenol level though probably will not be helpful but will still obtain.   -Patient wants to go home. I don't think this is unreasonable but will need repeat liver tests at our office  next week as well as a follow up. If he does get discharged then let us know so we can make arrangements for follow up as our office is closed tomorrow.  -If patient stays then would get a U/S with doppler  5. Thrombocytopenia. Platelets 93. U/S for LUQ pain done in June of this year ( My Chart on patient's phone) suggested mild splenomegaly. RUQ u/s this admission doesn't suggest splenomegaly but there is fatty infiltration.   6. 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  Patient takes significant amount of Motrin every day and is at risk for GI bleeding so recommend daily PPI once daily which he needs anyway for GERD sx.  7. GERD, almost daily heartburn.  -Omeprazole 20 mg daily 30 min before breakfast upon discharge.  -Inpatient he can take Protonix.   PLAN:    HPI: [name] is a 47 y.o. male with a history of ETOH abuse (significant use over last 8 years) and HTN. He is s/p gastric bypass and cholecystectomy. He presented to ED two days ago with tremors and hallucinations. He had stopped drinking ETOH a few days prior to admission. He has been admitted before for withdrawals. Patient has been in [place] for only a couple of months. He [place]CP ([place] of state ) in Sept. Liver tests were mildly elevated at that point and in pattern suggestive of ETOH. Labs ordered but patient  didn't get them down until late October:  Ferritin 128, B12, folate normal, TIBC 349, sat 33%, WBC 2.1, hgb 13.4, MCV 86, platelets 180, HCV negative, GGT149, HBsurface av negative.   Patient binge drinks, has required more ETOH for effect since his gastric bypass. It has taken advil nearly everyday since March for shoulder pain following a fall on ice. He developed lower abdominal cramping a few days ago. Yesterday he had a BM covered with blood. This am he had a small brown stool , also with BRB. No N/V/hematemesis. No [place] of colon cancer or liver disease. Patient apparently had a colonoscopy at  age 55 for unclear reasons. Says he was advised to have another at age 26. He occasionally gets constipated , depending on diet. Patient gives a history of frequent heartburn. He takes tums as needed. No other chronic GI complaints.    ED workup:   Afebrile, HR 105, BP 150/98 AST 188 , ALT 118 T. Bili 5.6 Alk phos normal Albumin normal. INR normal.   WBC 2.0 Hgb 13.6, MCV 88 Platelets 134 U/A - protein 100, ketones 80 UDS negative RUQ u/s - fatty liver    Past Medical History:  Diagnosis Date  . Hypertension     Past Surgical History:  Procedure Laterality Date  . CHOLECYSTECTOMY    . GASTRIC BYPASS      Prior to Admission medications   Medication Sig Start Date End Date Taking? Authorizing Provider  acetaminophen (TYLENOL) 325 MG tablet Take 650 mg by mouth every 6 (six) hours as needed for mild pain or headache.   Yes [provider]  amLODipine (NORVASC) 10 MG tablet Take 10 mg by mouth daily.   Yes [provider]  ibuprofen (ADVIL,MOTRIN) 200 MG tablet Take 600-800 mg by mouth every 6 (six) hours as needed for headache or mild pain.   Yes [provider]  Menthol-Methyl Salicylate (MUSCLE RUB) 10-15 % CREA Apply 1 application topically as needed for muscle pain.   Yes [provider]  LORazepam (ATIVAN) 1 MG tablet Take 1 tablet (1 mg total) by mouth every 6 (six) hours as needed (Alcohol withdrawal symptoms). Patient not taking: Reported on 01/14/2018 11/20/17   Daleen Bo, MD    Current Facility-Administered Medications  Medication Dose Route Frequency Provider Last Rate Last Dose  . 0.9 %  sodium chloride infusion   Intravenous Continuous Oswald Hillock, MD 100 mL/hr at 01/16/18 0140    . amLODipine (NORVASC) tablet 10 mg  10 mg Oral Daily Oswald Hillock, MD   10 mg at 01/15/18 1019  . docusate sodium (COLACE) capsule 100 mg  100 mg Oral BID Oswald Hillock, MD   100 mg at 01/15/18 1019  . feeding supplement (ENSURE ENLIVE)  (ENSURE ENLIVE) liquid 237 mL  237 mL Oral BID BM Oswald Hillock, MD   237 mL at 01/15/18 1455  . folic acid (FOLVITE) tablet 1 mg  1 mg Oral Daily Darrick Meigs, Gagan S, MD   1 mg at 01/15/18 1019  . hydrALAZINE (APRESOLINE) tablet 25 mg  25 mg Oral Q6H PRN Oswald Hillock, MD      . LORazepam (ATIVAN) injection 2-3 mg  2-3 mg Intravenous Q1H PRN Oswald Hillock, MD   3 mg at 01/15/18 2218  . MUSCLE RUB CREA 1 application  1 application Topical PRN Oswald Hillock, MD      . ondansetron [facility]) tablet 4 mg  4 mg Oral Q6H PRN [place],  Marge Duncans, MD       Or  . ondansetron [facility]) injection 4 mg  4 mg Intravenous Q6H PRN Oswald Hillock, MD      . thiamine (B-1) injection 100 mg  100 mg Intravenous Daily Oswald Hillock, MD   100 mg at 01/15/18 1018    Allergies as of 01/14/2018 - Review Complete 01/14/2018  Allergen Reaction Noted  . Oxycodone Anaphylaxis 11/20/2017  . Pollen extract Shortness Of Breath, Itching, and Other (See Comments) 11/20/2017    Family History  Problem Relation Age of Onset  . Leukemia Mother     Social History   Socioeconomic History  . Marital status: Single    Spouse name: Not on file  . Number of children: Not on file  . Years of education: Not on file  . Highest education level: Not on file  Occupational History  . Not on file  Social Needs  . Financial resource strain: Not on file  . Food insecurity:    Worry: Not on file    Inability: Not on file  . Transportation needs:    Medical: Not on file    Non-medical: Not on file  Tobacco Use  . Smoking status: Current Some Day Smoker    Types: Cigars  . Smokeless tobacco: Never Used  . Tobacco comment: black and mild cigars  Substance and Sexual Activity  . Alcohol use: Yes    Comment: 3 pints liquor daily  . Drug use: Not Currently  . Sexual activity: Not on file  Lifestyle  . Physical activity:    Days per week: Not on file    Minutes per session: Not on file  . Stress: Not on file  Relationships  . Social  connections:    Talks on phone: Not on file    Gets together: Not on file    Attends religious service: Not on file    Active member of club or organization: Not on file    Attends meetings of clubs or organizations: Not on file    Relationship status: Not on file  . Intimate partner violence:    Fear of current or ex partner: Not on file    Emotionally abused: Not on file    Physically abused: Not on file    Forced sexual activity: Not on file  Other Topics Concern  . Not on file  Social History Narrative  . Not on file    Review of Systems: All systems reviewed and negative except where noted in HPI.  Physical Exam: Vital signs in last 24 hours: Temp:  [98.1 F (36.7 C)-98.5 F (36.9 C)] 98.3 F (36.8 C) (11/28 0517) Pulse Rate:  [59-115] 95 (11/28 0517) Resp:  [11-23] 16 (11/28 0517) BP: (125-150)/(89-113) 126/98 (11/28 0517) SpO2:  [89 %-100 %] 99 % (11/28 0517) Weight:  [82.6 kg] 82.6 kg (11/27 2144) Last BM Date: 01/16/18 General:   Alert, well-developed, black male in NAD Psych:  Pleasant, cooperative. Normal mood and affect. Eyes:  Pupils equal, sclera clear, no icterus.   Conjunctiva pink. Ears:  Normal auditory acuity. Nose:  No deformity, discharge,  or lesions. Neck:  Supple; no masses Lungs:  Clear throughout to auscultation.   No wheezes, crackles, or rhonchi.  Heart:  Regular rate and rhythm; no murmurs, no lower extremity edema Abdomen:  Soft, non-distended, nontender, BS active, no palp mass    Rectal:  Small hemorrhoid tag. Brown stool on DRE. No obvious masses. No significant tenderness  on DRE speaking against fissure.  Msk:  Symmetrical without gross deformities. . Neurologic:  Alert and  oriented x4;  grossly normal neurologically. Skin:  Intact without significant lesions or rashes.   Intake/Output from previous day: 11/27 0701 - 11/28 0700 In: 3547.2 [P.O.:355; I.V.:3192.2] Out: 1250 [Urine:1250] Intake/Output this shift: Total I/O In: 120  [P.O.:120] Out: -   Lab Results: Recent Labs    01/15/18 0322 01/15/18 1851 01/16/18 0400  WBC 2.0* 2.4* 2.6*  HGB 11.5* 11.8* 11.7*  HCT 35.3* 36.7* 36.6*  PLT 99* 104* 93*   BMET Recent Labs    01/14/18 1500 01/15/18 0322 01/16/18 0404  NA 139 141 140  K 3.2* 3.4* 3.5  CL 99 106 107  CO2 27 28 27   GLUCOSE 101* 84 75  BUN 11 6 9   CREATININE 0.77 0.63 0.69  CALCIUM 9.4 8.6* 8.9   LFT Recent Labs    01/16/18 0404  PROT 6.6  ALBUMIN 4.0  AST 328*  ALT 257*  ALKPHOS 43  BILITOT 2.1*   PT/INR Recent Labs    01/14/18 1838 01/15/18 0322  LABPROT 13.5 13.3  INR 1.04 1.02     Studies/Results: US Abdomen Limited Ruq  Result Date: 01/15/2018 CLINICAL DATA:  Abnormal LFTs EXAM: ULTRASOUND ABDOMEN LIMITED RIGHT UPPER QUADRANT COMPARISON:  None. FINDINGS: Gallbladder: Prior cholecystectomy Common bile duct: Diameter: Normal caliber, 6 mm Liver: Increased echotexture compatible with fatty infiltration. No focal abnormality or biliary ductal dilatation. Portal vein is patent on color Doppler imaging with normal direction of blood flow towards the liver. IMPRESSION: Fatty infiltration of the liver. Prior cholecystectomy. No acute findings. Electronically Signed   By: Rolm Baptise M.D.   On: 01/15/2018 09:24     Tye Savoy, NP-C @  01/16/2018, 10:20 AM

## 2018-01-16 NOTE — Progress Notes (Signed)
Patient discharged to home, all discharged medications and instructions reviewed and questions answered.  Patient wants to d/c at this time, does not want to stay until liver ultrasound comleted at 5 pm today. States will follow up outpatient for the test. MD made aware.

## 2018-01-16 NOTE — Clinical Social Work Note (Signed)
Clinical Social Work Assessment  Patient Details  Name: [name] MRN: [id number] Date of Birth: 07/13/1970  Date of referral:  01/16/18               Reason for consult:  Substance Use/ETOH Abuse                Permission sought to share information with:    Permission granted to share information::  No  Name::        Agency::     Relationship::     Contact Information:     Housing/Transportation Living arrangements for the past 2 months:  Single Family Home Source of Information:  Patient Patient Interpreter Needed:  None Criminal Activity/Legal Involvement Pertinent to Current Situation/Hospitalization:  No - Comment as needed Significant Relationships:  Adult Children, Siblings Lives with:  Adult Children Do you feel safe going back to the place where you live?  Yes Need for family participation in patient care:  Yes (Comment)  Care giving concerns:   Patient has history of hypertension, prior cholecystectomy, alcohol abuse came to ED with complaints of tremors, anxiety hallucinations.  Patient had stopped drinking alcohol 3 days prior to admission. Patient admitted for alcohol withdrawal.  Social Worker assessment / plan:  CSW met with the patient at bedside and introduced social work role. Patient agreeable to talk with CSW. Patient reports he has been binge drinking with alcohol for several years. He reports, "this one time turn out not to be good." Patient reports this week he drank 4 gallons of vodka, 3-4 fifths of vodka and 1/2 pin of "better vodka". Patient reports he recently relocated from [place] to get away from a bad relationship and has been living with his daughter for the past 6-7 weeks. Patient reports the thought of his ex girlfriend triggers bad memories and triggers him to drink. Patient reports he has underlying issues and thoughts from his childhood that also trigger his drinking. Patient reports he has been to 30 day treatment programs before and does  not feel they work for him. He reports he can go thirty days without drinking on his own. Patient reports the most effective form of treatment has been AA groups. Patient reports he is open to psychiatric counseling.   CSW provided the patient with a list of [place] AA groups days and times/ list of Outpatient Psychiatric counseling programs.   Plan: AA group and Outpatient Psychiatric care.   Employment status:  Unemployed Forensic scientist:  Other (Comment Required)([employer]) PT Recommendations:  Not assessed at this time Information / Referral to community resources:  Outpatient Substance Abuse Treatment Options, Residential Substance Abuse Treatment Options, Outpatient Psychiatric Care (Comment Required)  Patient/Family's Response to care:  Agreeable and Responding well to care.   Patient/Family's Understanding of and Emotional Response to Diagnosis, Current Treatment, and Prognosis:  Patient tearful about his drinking and readiness to change and "finally talk to someone about my problems from the past." Patient understands that alcohol use is affecting his health.   Emotional Assessment Appearance:  Appears stated age Attitude/Demeanor/Rapport:    Affect (typically observed):  Accepting, Calm Orientation:  Oriented to Self, Oriented to Place, Oriented to  Time, Oriented to Situation Alcohol / Substance use:  Alcohol Use Psych involvement (Current and /or in the community):  No (Comment)  Discharge Needs  Concerns to be addressed:  Substance Abuse Concerns Readmission within the last 30 days:  No Current discharge risk:  Substance Abuse Barriers to Discharge:  No Barriers Identified   Lia Hopping, LCSW 01/16/2018, 1:02 PM

## 2018-01-16 NOTE — Progress Notes (Signed)
PHARMACIST - PHYSICIAN COMMUNICATION  DR:   Sharl MaLama  CONCERNING: IV to Oral Route Change Policy  RECOMMENDATION: This patient is receiving thiamine by the intravenous route.  Based on criteria approved by the Pharmacy and Therapeutics Committee, the intravenous medication(s) is/are being converted to the equivalent oral dose form(s).   DESCRIPTION: These criteria include:  The patient is eating (either orally or via tube) and/or has been taking other orally administered medications for a least 24 hours  The patient has no evidence of active gastrointestinal bleeding or impaired GI absorption (gastrectomy, short bowel, patient on TNA or NPO).  If you have questions about this conversion, please contact the Pharmacy Department  []   [phone]( [phone] )  [facility] []   [phone]( [phone] )  [facility] []   [phone]( [phone] )  [facility] []   [phone]( [phone] )  [facility] [x]   [phone]( [phone] )  [facility]   Loralee PacasErin Aarti Mankowski, PharmD, BCPS 01/16/2018 1:12 PM

## 2018-01-16 NOTE — Discharge Summary (Addendum)
Physician Discharge Summary  [name]:[id number] DOB: 1971-02-03 DOA: 01/14/2018  PCP: Patient, No Pcp Per  Admit date: 01/14/2018 Discharge date: 01/16/2018  Time spent: 45 minutes  Recommendations for Outpatient Follow-up:  1. Follow up GI in one week   Discharge Diagnoses:  Principal Problem:   Alcohol withdrawal (HCC) Active Problems:   Abnormal transaminases   Essential hypertension   Hypokalemia   Leukopenia   Discharge Condition: Stable  Diet recommendation: Regular diet  Filed Weights   01/14/18 1425 01/14/18 1428 01/15/18 2144  Weight: 86.2 kg 80.7 kg 82.6 kg    History of present illness:  47 year old male with a history of hypertension, prior cholecystectomy, alcohol abuse came to ED with complaints of tremors, anxiety hallucinations.  Patient had stopped drinking alcohol 3 days prior to admission.  Patient admitted for alcohol withdrawal.  Hospital Course:  1. Alcohol withdrawal-patient is at risk for DTs.  Resolved, he was started on  CIWA protocol. Social work has seen and given him info on outpatient psychiatric care/AA  2. Elevated LFTs-patient has mild elevation of LFTs and bilirubin.  Likely in setting of alcohol abuse.  He has had a history of prior cholecystectomy.  LFTs are rising. GI was consulted, US Liver doppler was ordered. Will follow up GI in office in one week. Also started on Omeprazole 20 mg po daily.  3. Leukopenia-WBC is 2.0 which is down from 2.6 yesterday.  Platelet count is also down to 99,000.  Will follow CBC in a.m.  Likely in setting of alcohol abuse.  4. Hypertension-blood pressure stable, continue Norvasc.  5. Hypokalemia- replete  6. Hematochezia- seen by GI, likely hemorrhoids. Will follow up GI in office. Start Anusol HC twice a day   Procedures:  US abdomen  Consultations:  GI  Discharge Exam: Vitals:   01/15/18 2144 01/16/18 0517  BP: (!) 145/100 (!) 126/98  Pulse: 94 95  Resp: 20 16  Temp:  98.5 F (36.9 C) 98.3 F (36.8 C)  SpO2: 100% 99%    General: Appears in no acute distress Cardiovascular: S1S2 RRR Respiratory: Clear bilaterally  Discharge Instructions   Discharge Instructions    Diet - low sodium heart healthy   Complete by:  As directed    Increase activity slowly   Complete by:  As directed      Allergies as of 01/16/2018      Reactions   Oxycodone Anaphylaxis   Pollen Extract Shortness Of Breath, Itching, Other (See Comments)   Runny nose, itchy eyes, sniffles      Medication List    STOP taking these medications   LORazepam 1 MG tablet Commonly known as:  ATIVAN     TAKE these medications   acetaminophen 325 MG tablet Commonly known as:  TYLENOL Take 650 mg by mouth every 6 (six) hours as needed for mild pain or headache.   amLODipine 10 MG tablet Commonly known as:  NORVASC Take 10 mg by mouth daily.   docusate sodium 100 MG capsule Commonly known as:  COLACE Take 1 capsule (100 mg total) by mouth 2 (two) times daily.   folic acid 1 MG tablet Commonly known as:  FOLVITE Take 1 tablet (1 mg total) by mouth daily. Start taking on:  01/17/2018   hydrocortisone 25 MG suppository Commonly known as:  ANUSOL-HC Place 1 suppository (25 mg total) rectally 2 (two) times daily.   ibuprofen 200 MG tablet Commonly known as:  ADVIL,MOTRIN Take 600-800 mg by mouth every 6 (  six) hours as needed for headache or mild pain.   MUSCLE RUB 10-15 % Crea Apply 1 application topically as needed for muscle pain.   omeprazole 20 MG tablet Commonly known as:  PRILOSEC OTC Take 1 tablet (20 mg total) by mouth daily.   thiamine 100 MG tablet Take 1 tablet (100 mg total) by mouth daily. Start taking on:  01/17/2018      Allergies  Allergen Reactions  . Oxycodone Anaphylaxis  . Pollen Extract Shortness Of Breath, Itching and Other (See Comments)    Runny nose, itchy eyes, sniffles   Follow-up Information    Mansouraty, Netty StarringGabriel Jr., MD.  Schedule an appointment as soon as possible for a visit in 1 week(s).   Specialties:  Gastroenterology, Internal Medicine Contact information: [address] [place] [address] [phone]            The results of significant diagnostics from this hospitalization (including imaging, microbiology, ancillary and laboratory) are listed below for reference.    Significant Diagnostic Studies: Koreas Abdomen Limited Ruq  Result Date: 01/15/2018 CLINICAL DATA:  Abnormal LFTs EXAM: ULTRASOUND ABDOMEN LIMITED RIGHT UPPER QUADRANT COMPARISON:  None. FINDINGS: Gallbladder: Prior cholecystectomy Common bile duct: Diameter: Normal caliber, 6 mm Liver: Increased echotexture compatible with fatty infiltration. No focal abnormality or biliary ductal dilatation. Portal vein is patent on color Doppler imaging with normal direction of blood flow towards the liver. IMPRESSION: Fatty infiltration of the liver. Prior cholecystectomy. No acute findings. Electronically Signed   By: Charlett NoseKevin  Dover M.D.   On: 01/15/2018 09:24    Microbiology: Recent Results (from the past 240 hour(s))  MRSA PCR Screening     Status: None   Collection Time: 01/14/18  6:57 PM  Result Value Ref Range Status   MRSA by PCR NEGATIVE NEGATIVE Final    Comment:        The GeneXpert MRSA Assay (FDA approved for NASAL specimens only), is one component of a comprehensive MRSA colonization surveillance program. It is not intended to diagnose MRSA infection nor to guide or monitor treatment for MRSA infections. Performed at [facility], [address] [place], [place] [address]      Labs: Basic Metabolic Panel: Recent Labs  Lab 01/14/18 1500 01/15/18 0322 01/16/18 0404  NA 139 141 140  K 3.2* 3.4* 3.5  CL 99 106 107  CO2 27 28 27   GLUCOSE 101* 84 75  BUN 11 6 9   CREATININE 0.77 0.63 0.69  CALCIUM 9.4 8.6* 8.9   Liver Function Tests: Recent Labs  Lab 01/14/18 1500 01/15/18 0322 01/16/18 0404   AST 188* 220* 328*  ALT 118* 135* 257*  ALKPHOS 52 41 43  BILITOT 5.6* 3.7* 2.1*  PROT 8.1 6.5 6.6  ALBUMIN 4.8 3.9 4.0   Recent Labs  Lab 01/14/18 1500  LIPASE 34   Recent Labs  Lab 01/14/18 1522  AMMONIA 29   CBC: Recent Labs  Lab 01/14/18 1500 01/15/18 0322 01/15/18 1851 01/16/18 0400  WBC 2.6* 2.0* 2.4* 2.6*  HGB 13.6 11.5* 11.8* 11.7*  HCT 41.2 35.3* 36.7* 36.6*  MCV 88.2 90.5 91.1 89.9  PLT 134* 99* 104* 93*       Signed:  Meredeth IdeGagan S Kyree Adriano MD.  [facility] 01/16/2018, 2:18 PM

## 2018-01-17 LAB — HEPATITIS PANEL, ACUTE
HEP A IGM: NEGATIVE
Hep B C IgM: NEGATIVE
Hepatitis B Surface Ag: NEGATIVE

## 2018-01-20 ENCOUNTER — Telehealth: Payer: Self-pay

## 2018-01-20 ENCOUNTER — Other Ambulatory Visit: Payer: Self-pay

## 2018-01-20 DIAGNOSIS — R748 Abnormal levels of other serum enzymes: Secondary | ICD-10-CM

## 2018-01-20 NOTE — Telephone Encounter (Signed)
No answer. Lab order is entered.

## 2018-01-20 NOTE — Telephone Encounter (Signed)
-----   Message from Meredith PelPaula M Guenther, NP sent at 01/17/2018  2:18 PM EST ----- Waynetta SandyBeth, please call patient on Monday and arrange for hepatic function panel on Tuesday or Wed. Dx. Abnormal liver panel. Also, please get him a hospital follow up with me.   Thanks

## 2018-01-20 NOTE — Telephone Encounter (Signed)
No answer. Voicemail is full and cannot accept messages. 

## 2018-01-23 NOTE — Telephone Encounter (Signed)
No answer. Cannot leave a message.

## 2018-01-28 NOTE — Telephone Encounter (Signed)
Reached the patient. He agrees to come in for labs. Indicates living in [place] is a temporary situation. He asks for an email. Not able to discuss details because he is picking up his grandchild.

## 2018-01-29 ENCOUNTER — Other Ambulatory Visit (INDEPENDENT_AMBULATORY_CARE_PROVIDER_SITE_OTHER): Payer: BLUE CROSS/BLUE SHIELD

## 2018-01-29 DIAGNOSIS — R748 Abnormal levels of other serum enzymes: Secondary | ICD-10-CM | POA: Diagnosis not present

## 2018-01-29 LAB — HEPATIC FUNCTION PANEL
ALBUMIN: 4.2 g/dL (ref 3.5–5.2)
ALK PHOS: 53 U/L (ref 39–117)
ALT: 48 U/L (ref 0–53)
AST: 29 U/L (ref 0–37)
BILIRUBIN TOTAL: 1.4 mg/dL — AB (ref 0.2–1.2)
Bilirubin, Direct: 0.2 mg/dL (ref 0.0–0.3)
Total Protein: 7.3 g/dL (ref 6.0–8.3)

## 2018-02-17 ENCOUNTER — Other Ambulatory Visit: Payer: Self-pay

## 2018-02-17 ENCOUNTER — Encounter (HOSPITAL_COMMUNITY): Payer: Self-pay

## 2018-02-17 ENCOUNTER — Inpatient Hospital Stay (HOSPITAL_COMMUNITY)
Admission: EM | Admit: 2018-02-17 | Discharge: 2018-02-20 | DRG: 894 | Discharge status: Against Medical Advice | Payer: BLUE CROSS/BLUE SHIELD | Attending: Pulmonary Disease | Admitting: Pulmonary Disease

## 2018-02-17 DIAGNOSIS — R109 Unspecified abdominal pain: Secondary | ICD-10-CM

## 2018-02-17 DIAGNOSIS — Z9049 Acquired absence of other specified parts of digestive tract: Secondary | ICD-10-CM | POA: Diagnosis not present

## 2018-02-17 DIAGNOSIS — G92 Toxic encephalopathy: Secondary | ICD-10-CM | POA: Diagnosis present

## 2018-02-17 DIAGNOSIS — F10239 Alcohol dependence with withdrawal, unspecified: Secondary | ICD-10-CM | POA: Diagnosis present

## 2018-02-17 DIAGNOSIS — E872 Acidosis: Secondary | ICD-10-CM | POA: Diagnosis present

## 2018-02-17 DIAGNOSIS — K921 Melena: Secondary | ICD-10-CM | POA: Diagnosis present

## 2018-02-17 DIAGNOSIS — I10 Essential (primary) hypertension: Secondary | ICD-10-CM | POA: Diagnosis present

## 2018-02-17 DIAGNOSIS — E8729 Other acidosis: Secondary | ICD-10-CM

## 2018-02-17 DIAGNOSIS — D72819 Decreased white blood cell count, unspecified: Secondary | ICD-10-CM | POA: Diagnosis not present

## 2018-02-17 DIAGNOSIS — F319 Bipolar disorder, unspecified: Secondary | ICD-10-CM | POA: Diagnosis present

## 2018-02-17 DIAGNOSIS — Z781 Physical restraint status: Secondary | ICD-10-CM

## 2018-02-17 DIAGNOSIS — F419 Anxiety disorder, unspecified: Secondary | ICD-10-CM | POA: Diagnosis present

## 2018-02-17 DIAGNOSIS — T461X5A Adverse effect of calcium-channel blockers, initial encounter: Secondary | ICD-10-CM | POA: Diagnosis not present

## 2018-02-17 DIAGNOSIS — Z806 Family history of leukemia: Secondary | ICD-10-CM | POA: Diagnosis not present

## 2018-02-17 DIAGNOSIS — G9341 Metabolic encephalopathy: Secondary | ICD-10-CM | POA: Diagnosis not present

## 2018-02-17 DIAGNOSIS — F10231 Alcohol dependence with withdrawal delirium: Principal | ICD-10-CM | POA: Diagnosis present

## 2018-02-17 DIAGNOSIS — F17290 Nicotine dependence, other tobacco product, uncomplicated: Secondary | ICD-10-CM | POA: Diagnosis present

## 2018-02-17 DIAGNOSIS — Z9884 Bariatric surgery status: Secondary | ICD-10-CM

## 2018-02-17 DIAGNOSIS — K7010 Alcoholic hepatitis without ascites: Secondary | ICD-10-CM | POA: Diagnosis present

## 2018-02-17 DIAGNOSIS — I959 Hypotension, unspecified: Secondary | ICD-10-CM | POA: Diagnosis not present

## 2018-02-17 DIAGNOSIS — Y906 Blood alcohol level of 120-199 mg/100 ml: Secondary | ICD-10-CM | POA: Diagnosis present

## 2018-02-17 DIAGNOSIS — Z885 Allergy status to narcotic agent status: Secondary | ICD-10-CM

## 2018-02-17 DIAGNOSIS — F10939 Alcohol use, unspecified with withdrawal, unspecified: Secondary | ICD-10-CM | POA: Diagnosis present

## 2018-02-17 DIAGNOSIS — R4182 Altered mental status, unspecified: Secondary | ICD-10-CM | POA: Diagnosis present

## 2018-02-17 LAB — CBC WITH DIFFERENTIAL/PLATELET
Abs Immature Granulocytes: 0 10*3/uL (ref 0.00–0.07)
Basophils Absolute: 0 10*3/uL (ref 0.0–0.1)
Basophils Relative: 1 %
EOS ABS: 0 10*3/uL (ref 0.0–0.5)
EOS PCT: 0 %
HEMATOCRIT: 41 % (ref 39.0–52.0)
Hemoglobin: 13.2 g/dL (ref 13.0–17.0)
Immature Granulocytes: 0 %
LYMPHS ABS: 0.6 10*3/uL — AB (ref 0.7–4.0)
Lymphocytes Relative: 19 %
MCH: 28.7 pg (ref 26.0–34.0)
MCHC: 32.2 g/dL (ref 30.0–36.0)
MCV: 89.1 fL (ref 80.0–100.0)
MONOS PCT: 7 %
Monocytes Absolute: 0.2 10*3/uL (ref 0.1–1.0)
NRBC: 0 % (ref 0.0–0.2)
Neutro Abs: 2.1 10*3/uL (ref 1.7–7.7)
Neutrophils Relative %: 73 %
Platelets: 224 10*3/uL (ref 150–400)
RBC: 4.6 MIL/uL (ref 4.22–5.81)
RDW: 13.6 % (ref 11.5–15.5)
WBC: 2.9 10*3/uL — ABNORMAL LOW (ref 4.0–10.5)

## 2018-02-17 LAB — COMPREHENSIVE METABOLIC PANEL
ALT: 60 U/L — ABNORMAL HIGH (ref 0–44)
ANION GAP: 23 — AB (ref 5–15)
AST: 73 U/L — ABNORMAL HIGH (ref 15–41)
Albumin: 4.3 g/dL (ref 3.5–5.0)
Alkaline Phosphatase: 59 U/L (ref 38–126)
BILIRUBIN TOTAL: 3.3 mg/dL — AB (ref 0.3–1.2)
BUN: 14 mg/dL (ref 6–20)
CO2: 16 mmol/L — ABNORMAL LOW (ref 22–32)
Calcium: 8.3 mg/dL — ABNORMAL LOW (ref 8.9–10.3)
Chloride: 100 mmol/L (ref 98–111)
Creatinine, Ser: 0.7 mg/dL (ref 0.61–1.24)
GFR calc Af Amer: >60 mL/min (ref >60)
Glucose, Bld: 70 mg/dL (ref 70–99)
POTASSIUM: 3.7 mmol/L (ref 3.5–5.1)
Sodium: 139 mmol/L (ref 135–145)
TOTAL PROTEIN: 7.8 g/dL (ref 6.5–8.1)

## 2018-02-17 LAB — BLOOD GAS, VENOUS
Acid-base deficit: 5.1 mmol/L — ABNORMAL HIGH (ref 0.0–2.0)
Bicarbonate: 18.7 mmol/L — ABNORMAL LOW (ref 20.0–28.0)
FIO2: 21
O2 Saturation: 95.2 %
Patient temperature: 98.6
pCO2, Ven: 32.7 mmHg — ABNORMAL LOW (ref 44.0–60.0)
pH, Ven: 7.377 (ref 7.250–7.430)
pO2, Ven: 79 mmHg — ABNORMAL HIGH (ref 32.0–45.0)

## 2018-02-17 LAB — PROTIME-INR
INR: 1.1
Prothrombin Time: 14.1 seconds (ref 11.4–15.2)

## 2018-02-17 LAB — MAGNESIUM: Magnesium: 1.9 mg/dL (ref 1.7–2.4)

## 2018-02-17 LAB — ETHANOL: ALCOHOL ETHYL (B): 140 mg/dL — AB (ref <10)

## 2018-02-17 LAB — BETA-HYDROXYBUTYRIC ACID: Beta-Hydroxybutyric Acid: 3.65 mmol/L — ABNORMAL HIGH (ref 0.05–0.27)

## 2018-02-17 MED ORDER — FOLIC ACID 1 MG PO TABS
1.0000 mg | ORAL_TABLET | Freq: Once | ORAL | Status: AC
  Administered 2018-02-17: 1 mg via ORAL
  Filled 2018-02-17: qty 1

## 2018-02-17 MED ORDER — LORAZEPAM 2 MG/ML IJ SOLN
2.0000 mg | Freq: Once | INTRAMUSCULAR | Status: AC
  Administered 2018-02-17: 2 mg via INTRAVENOUS

## 2018-02-17 MED ORDER — ACETAMINOPHEN 325 MG PO TABS
650.0000 mg | ORAL_TABLET | Freq: Four times a day (QID) | ORAL | Status: DC | PRN
  Filled 2018-02-17 (×3): qty 2

## 2018-02-17 MED ORDER — SODIUM CHLORIDE 0.9 % IV SOLN
INTRAVENOUS | Status: DC
  Administered 2018-02-17: 23:00:00 via INTRAVENOUS

## 2018-02-17 MED ORDER — LORAZEPAM 1 MG PO TABS: 0.0000 mg | ORAL_TABLET | Freq: Two times a day (BID) | ORAL | Status: DC

## 2018-02-17 MED ORDER — LORAZEPAM 1 MG PO TABS
1.0000 mg | ORAL_TABLET | Freq: Once | ORAL | Status: AC
  Administered 2018-02-17: 1 mg via ORAL
  Filled 2018-02-17: qty 1

## 2018-02-17 MED ORDER — MUSCLE RUB 10-15 % EX CREA
1.0000 "application " | TOPICAL_CREAM | CUTANEOUS | Status: DC | PRN
  Administered 2018-02-17: 1 via TOPICAL
  Filled 2018-02-17 (×2): qty 85

## 2018-02-17 MED ORDER — AMLODIPINE BESYLATE 10 MG PO TABS
10.0000 mg | ORAL_TABLET | Freq: Every day | ORAL | Status: DC
  Administered 2018-02-18: 10 mg via ORAL
  Filled 2018-02-17: qty 1

## 2018-02-17 MED ORDER — ONDANSETRON HCL 4 MG PO TABS: 4.0000 mg | ORAL_TABLET | Freq: Four times a day (QID) | ORAL | Status: DC | PRN

## 2018-02-17 MED ORDER — LORAZEPAM 2 MG/ML IJ SOLN: 0.0000 mg | Freq: Two times a day (BID) | INTRAMUSCULAR | Status: DC

## 2018-02-17 MED ORDER — CHLORDIAZEPOXIDE HCL 25 MG PO CAPS
50.0000 mg | ORAL_CAPSULE | Freq: Once | ORAL | Status: AC
  Administered 2018-02-17: 50 mg via ORAL
  Filled 2018-02-17: qty 2

## 2018-02-17 MED ORDER — DEXTROSE-NACL 5-0.45 % IV SOLN
INTRAVENOUS | Status: DC
  Administered 2018-02-17: 20:00:00 via INTRAVENOUS

## 2018-02-17 MED ORDER — LORAZEPAM 2 MG/ML IJ SOLN
2.0000 mg | Freq: Once | INTRAMUSCULAR | Status: AC
  Administered 2018-02-17: 2 mg via INTRAVENOUS
  Filled 2018-02-17: qty 1

## 2018-02-17 MED ORDER — LORAZEPAM 2 MG/ML IJ SOLN
2.0000 mg | INTRAMUSCULAR | Status: DC | PRN
  Administered 2018-02-18: 3 mg via INTRAVENOUS
  Administered 2018-02-18: 2 mg via INTRAVENOUS
  Administered 2018-02-19: 3 mg via INTRAVENOUS
  Filled 2018-02-17 (×2): qty 1
  Filled 2018-02-17 (×3): qty 2

## 2018-02-17 MED ORDER — DEXTROSE-NACL 5-0.45 % IV SOLN
INTRAVENOUS | Status: DC
  Administered 2018-02-17 – 2018-02-20 (×4): via INTRAVENOUS

## 2018-02-17 MED ORDER — ACETAMINOPHEN 325 MG PO TABS
650.0000 mg | ORAL_TABLET | Freq: Four times a day (QID) | ORAL | Status: DC | PRN
  Administered 2018-02-17 – 2018-02-19 (×4): 650 mg via ORAL
  Filled 2018-02-17: qty 2

## 2018-02-17 MED ORDER — FOLIC ACID 1 MG PO TABS
1.0000 mg | ORAL_TABLET | Freq: Every day | ORAL | Status: DC
  Administered 2018-02-18 – 2018-02-19 (×2): 1 mg via ORAL
  Filled 2018-02-17 (×3): qty 1

## 2018-02-17 MED ORDER — ONDANSETRON HCL 4 MG/2ML IJ SOLN: 4.0000 mg | Freq: Four times a day (QID) | INTRAMUSCULAR | Status: DC | PRN

## 2018-02-17 MED ORDER — ACETAMINOPHEN 650 MG RE SUPP: 650.0000 mg | Freq: Four times a day (QID) | RECTAL | Status: DC | PRN

## 2018-02-17 MED ORDER — LORAZEPAM 2 MG/ML IJ SOLN
0.0000 mg | Freq: Four times a day (QID) | INTRAMUSCULAR | Status: DC
  Administered 2018-02-17: 2 mg via INTRAVENOUS
  Administered 2018-02-18: 1 mg via INTRAVENOUS
  Administered 2018-02-18: 2 mg via INTRAVENOUS
  Administered 2018-02-18: 4 mg via INTRAVENOUS
  Administered 2018-02-19: 2 mg via INTRAVENOUS
  Filled 2018-02-17 (×3): qty 1

## 2018-02-17 MED ORDER — LORAZEPAM 1 MG PO TABS: 0.0000 mg | ORAL_TABLET | Freq: Four times a day (QID) | ORAL | Status: DC

## 2018-02-17 MED ORDER — VITAMIN B-12 1000 MCG PO TABS
1000.0000 ug | ORAL_TABLET | Freq: Every day | ORAL | Status: DC
  Administered 2018-02-18 – 2018-02-19 (×2): 1000 ug via ORAL
  Filled 2018-02-17 (×3): qty 1

## 2018-02-17 MED ORDER — SODIUM CHLORIDE 0.9 % IV BOLUS
1000.0000 mL | Freq: Once | INTRAVENOUS | Status: AC
  Administered 2018-02-17: 1000 mL via INTRAVENOUS

## 2018-02-17 MED ORDER — THIAMINE HCL 100 MG/ML IJ SOLN: 100.0000 mg | Freq: Once | INTRAMUSCULAR | Status: DC

## 2018-02-17 MED ORDER — LORAZEPAM 2 MG/ML IJ SOLN
1.0000 mg | Freq: Once | INTRAMUSCULAR | Status: DC
  Filled 2018-02-17: qty 1

## 2018-02-17 MED ORDER — VITAMIN B-1 100 MG PO TABS
100.0000 mg | ORAL_TABLET | Freq: Every day | ORAL | Status: DC
  Administered 2018-02-18 – 2018-02-19 (×2): 100 mg via ORAL
  Filled 2018-02-17 (×3): qty 1

## 2018-02-17 NOTE — ED Triage Notes (Signed)
Per EMS: Pt from home.  pts last drink was this am (unsure of time).  Pt began having withdrawal symptoms this afternoon.  C/o of nausea, tremors, L abdominal pain, and tremors.  Pt usually drinks about a 1.75 liters in 2 days.

## 2018-02-17 NOTE — H&P (Signed)
Nathan BooksJustin L Edwards JYN:[id number] DOB: Nov 09, 1970 DOA: 02/17/2018     PCP: Patient, No Pcp Per   Outpatient Specialists:     Patient arrived to ER on 02/17/18 at 1703  Patient coming from: home Lives alone,     Chief Complaint:  Chief Complaint  Patient presents with  . Alcohol Intoxication    HPI: [name] is a 47 y.o. male with medical history significant of alcohol abuse HTN, anxiety originally from [place] have been here for 3 months used to be a Radiation protection practitioneraramedic   Presented with withdrawal symptoms states his last alcoholic drink was this morning but he started to have withdrawal symptoms by afternoon complaining nausea tremors left abdominal pain he usually drinks about 1.75 L  every 2 days No fevers or chills no associated vomiting Admitted in November discharged on 28 November for alcohol withdrawal with DTs  Regarding pertinent Chronic problems: Leukocytosis today WBC count 2.9 has abnormally low lymphocyte count down to 0.6   While in ER: Noted to have elevated anion gap up to 23 suspect to have alcohol ketoacidosis Started on IV fluids D5 half-normal saline Withdrawal symptoms improved with benzodiazepines administered in the emergency department The following Work up has been ordered so far:  Orders Placed This Encounter  Procedures  . Critical Care  . CBC with Differential/Platelet  . Comprehensive metabolic panel  . Ethanol  . Magnesium  . Beta-hydroxybutyric acid  . Clinical institute withdrawal assessment every 6 hours X 48 hours, then every 12 hours x 48 hours  . Notify MD if CIWA-AR is greater than 10 for 2 consecutive assessments.  . May administer Ativan PO versus IV if patient is tolerating PO intake well  . Vital signs every 6 hours X 48 hours, then per unit protocol  . Consult to hospitalist    Following Medications were ordered in ER: Medications  LORazepam (ATIVAN) injection 0-4 mg (has no administration in time range)    Or  LORazepam  (ATIVAN) tablet 0-4 mg (has no administration in time range)  LORazepam (ATIVAN) injection 0-4 mg (has no administration in time range)    Or  LORazepam (ATIVAN) tablet 0-4 mg (has no administration in time range)  dextrose 5 %-0.45 % sodium chloride infusion (has no administration in time range)  sodium chloride 0.9 % bolus 1,000 mL (0 mLs Intravenous Stopped 02/17/18 1820)  folic acid (FOLVITE) tablet 1 mg (1 mg Oral Given 02/17/18 1747)  LORazepam (ATIVAN) injection 2 mg (2 mg Intravenous Given 02/17/18 1723)  LORazepam (ATIVAN) tablet 1 mg (1 mg Oral Given 02/17/18 1905)  chlordiazePOXIDE (LIBRIUM) capsule 50 mg (50 mg Oral Given 02/17/18 1904)    Significant initial  Findings: Abnormal Labs Reviewed  CBC WITH DIFFERENTIAL/PLATELET - Abnormal; Notable for the following components:      Result Value   WBC 2.9 (*)    Lymphs Abs 0.6 (*)    All other components within normal limits  COMPREHENSIVE METABOLIC PANEL - Abnormal; Notable for the following components:   CO2 16 (*)    Calcium 8.3 (*)    AST 73 (*)    ALT 60 (*)    Total Bilirubin 3.3 (*)    Anion gap 23 (*)    All other components within normal limits  ETHANOL - Abnormal; Notable for the following components:   Alcohol, Ethyl (B) 140 (*)    All other components within normal limits     Lactic Acid, Venous No results found for:  LATICACIDVEN  Na 139 K 3.7  Cr    stable,   Lab Results  Component Value Date   CREATININE 0.70 02/17/2018   CREATININE 0.69 01/16/2018   CREATININE 0.63 01/15/2018    Mg 1.9 EtOH 140  WBC  2.9  HG/HCT   Stable     Component Value Date/Time   HGB 13.2 02/17/2018 1747   HCT 41.0 02/17/2018 1747       UA  not ordered   ECG:  Ordered   ED Triage Vitals  Enc Vitals Group     BP 02/17/18 1720 (!) 145/108     Pulse Rate 02/17/18 1720 (!) 112     Resp 02/17/18 1720 18     Temp 02/17/18 1720 99.8 F (37.7 C)     Temp Source 02/17/18 1720 Oral     SpO2 02/17/18 1716 99 %       Weight 02/17/18 1726 190 lb (86.2 kg)     Height 02/17/18 1726 5\' 10"  (1.778 m)     Head Circumference --      Peak Flow --      Pain Score 02/17/18 1857 9     Pain Loc --      Pain Edu? --      Excl. in GC? --   TMAX(24)@       Latest  Blood pressure (!) 145/108, pulse (!) 112, temperature 99.8 F (37.7 C), temperature source Oral, resp. rate 18, height 5\' 10"  (1.778 m), weight 86.2 kg, SpO2 99 %.     Hospitalist was called for admission for Alcoholic ketoacidosis and alcohol withdrawal   Review of Systems:    Pertinent positives include: abdominal pain, insomnia, poor apetitite  nausea, vomiting, Constitutional:  No weight loss, night sweats, Fevers, chills, fatigue, weight loss  HEENT:  No headaches, Difficulty swallowing,Tooth/dental problems,Sore throat,  No sneezing, itching, ear ache, nasal congestion, post nasal drip,  Cardio-vascular:  No chest pain, Orthopnea, PND, anasarca, dizziness, palpitations.no Bilateral lower extremity swelling  GI:  No heartburn, indigestion, diarrhea, change in bowel habits, loss of appetite, melena, blood in stool, hematemesis Resp:  no shortness of breath at rest. No dyspnea on exertion, No excess mucus, no productive cough, No non-productive cough, No coughing up of blood.No change in color of mucus.No wheezing. Skin:  no rash or lesions. No jaundice GU:  no dysuria, change in color of urine, no urgency or frequency. No straining to urinate.  No flank pain.  Musculoskeletal:  No joint pain or no joint swelling. No decreased range of motion. No back pain.  Psych:  No change in mood or affect. No depression or anxiety. No memory loss.  Neuro: no localizing neurological complaints, no tingling, no weakness, no double vision, no gait abnormality, no slurred speech, no confusion  All systems reviewed and apart from HOPI all are negative  Past Medical History:   Past Medical History:  Diagnosis Date  . Hypertension        Past Surgical History:  Procedure Laterality Date  . CHOLECYSTECTOMY    . GASTRIC BYPASS      Social History:  Ambulatory   Independently      reports that he has been smoking cigars. He has never used smokeless tobacco. He reports current alcohol use. He reports previous drug use.     Family History:   Family History  Problem Relation Age of Onset  . Leukemia Mother     Allergies: Allergies  Allergen Reactions  . Oxycodone  Anaphylaxis  . Pollen Extract Shortness Of Breath, Itching and Other (See Comments)    Runny nose, itchy eyes, sniffles     Prior to Admission medications   Medication Sig Start Date End Date Taking? Authorizing Provider  amLODipine (NORVASC) 10 MG tablet Take 10 mg by mouth daily.   Yes [provider]  folic acid (FOLVITE) 1 MG tablet Take 1 tablet (1 mg total) by mouth daily. 01/17/18  Yes Meredeth Ide, MD  Menthol-Methyl Salicylate (MUSCLE RUB) 10-15 % CREA Apply 1 application topically as needed for muscle pain.   Yes [provider]  niacin 100 MG tablet Take 100 mg by mouth daily.   Yes [provider]  omeprazole (PRILOSEC OTC) 20 MG tablet Take 1 tablet (20 mg total) by mouth daily. 01/16/18 03/17/18 Yes Meredeth Ide, MD  thiamine 100 MG tablet Take 1 tablet (100 mg total) by mouth daily. 01/17/18  Yes Meredeth Ide, MD  vitamin B-12 (CYANOCOBALAMIN) 1000 MCG tablet Take 1,000 mcg by mouth daily.   Yes [provider]  docusate sodium (COLACE) 100 MG capsule Take 1 capsule (100 mg total) by mouth 2 (two) times daily. Patient not taking: Reported on 02/17/2018 01/16/18   Meredeth Ide, MD  hydrocortisone (ANUSOL-HC) 25 MG suppository Place 1 suppository (25 mg total) rectally 2 (two) times daily. Patient not taking: Reported on 02/17/2018 01/16/18   Meredeth Ide, MD   Physical Exam: Blood pressure (!) 145/108, pulse (!) 112, temperature 99.8 F (37.7 C), temperature source Oral, resp. rate 18, height  5\' 10"  (1.778 m), weight 86.2 kg, SpO2 99 %. 1. General:  in No Acute distress   Chronically ill -appearing 2. Psychological: Alert and   Oriented 3. Head/ENT:   Moist Mucous Membranes                          Head Non traumatic, neck supple                           Poor Dentition 4. SKIN:   decreased Skin turgor,  Skin clean Dry and intact no rash 5. Heart: Regular rate and rhythm no  Murmur, no Rub or gallop 6. Lungs:   no wheezes or crackles   7. Abdomen: Soft,  non-tender, Non distended  bowel sounds present 8. Lower extremities: no clubbing, cyanosis, or  edema 9. Neurologically Grossly intact, moving all 4 extremities equally  10. MSK: Normal range of motion   LABS:     Recent Labs  Lab 02/17/18 1747  WBC 2.9*  NEUTROABS 2.1  HGB 13.2  HCT 41.0  MCV 89.1  PLT 224   Basic Metabolic Panel: Recent Labs  Lab 02/17/18 1747  NA 139  K 3.7  CL 100  CO2 16*  GLUCOSE 70  BUN 14  CREATININE 0.70  CALCIUM 8.3*  MG 1.9     Recent Labs  Lab 02/17/18 1747  AST 73*  ALT 60*  ALKPHOS 59  BILITOT 3.3*  PROT 7.8  ALBUMIN 4.3   No results for input(s): LIPASE, AMYLASE in the last 168 hours. No results for input(s): AMMONIA in the last 168 hours.    HbA1C: No results for input(s): HGBA1C in the last 72 hours. CBG: No results for input(s): GLUCAP in the last 168 hours.    Urine analysis:    Cultures: No results found for: SDES, SPECREQUEST, CULT, REPTSTATUS  Radiological Exams on Admission: No results found.  Chart has been reviewed    Assessment/Plan  47 y.o. 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 Alcoholic ketoacidosis -VBG unremarkable continue to monitor serial labs and  rehydrate . Alcohol withdrawal (HCC) -order to stepdown given severity of alcohol  withdrawal order CIWA protocol thiamine and folate acid patient is interested in quitting.  Order behavioral health consult . Essential hypertension -restart home medications . Leukopenia -chronic unchanged previously been tested for HIV and was found to be negative   Other plan as per orders.  DVT prophylaxis:  SCD      Code Status:  FULL CODE  as per patient   I had personally discussed CODE STATUS with patient   Family Communication:   Family not  at  Bedside    Disposition Plan:    To home once workup is complete and patient is stable                                       Social Work  consulted                                      Consults called: none  Admission status:  inpatient     Expect 2 midnight stay secondary to severity of patient's current illness including   hemodynamic instability despite optimal treatment (tachycardia  )  Severe lab/radiological abnormalities including: Metabolic acidosis and extensive comorbidities including:  substance abuse  That are currently affecting medical management.  I expect  patient to be hospitalized for 2 midnights requiring inpatient medical care.  Patient is at high risk for adverse outcome (such as loss of life or disability) if not treated.  Indication for inpatient stay as follows:  Hemodynamic instability despite maximal medical therapy,        Need for  IV fluids  IV medications       Level of care         SDU tele indefinitely please discontinue once patient no longer qualifies       [place] 02/17/2018, 8:25 PM    [facility]  Pager [phone]   after 2 AM please page floor coverage PA If 7AM-7PM, please contact the day team taking care of the patient  [email or web address]  Password [email or web address]

## 2018-02-17 NOTE — ED Provider Notes (Signed)
[facility]-EMERGENCY DEPT Provider Note   CSN: [id number] Arrival date & time: 02/17/18  1703     History   Chief Complaint Chief Complaint  Patient presents with  . Alcohol Intoxication   Level 5 caveat: Altered mental status  HPI [name] is a 47 y.o. male.  HPI 47 year old male presents the emergency department with hallucinations and tachycardia and concern for possible alcohol withdrawal.  He has a history of alcohol withdrawal requiring admission the hospital before.  He is having hallucinations and tremors.  His last drink was earlier this morning.  He has been on a rather heavy alcohol binge over the past several weeks.  He denies fevers and chills.  No vomiting.   Past Medical History:  Diagnosis Date  . Hypertension     Patient Active Problem List   Diagnosis Date Noted  . Alcohol withdrawal (HCC) 01/14/2018  . Abnormal transaminases 01/14/2018  . Essential hypertension 01/14/2018  . Hypokalemia 01/14/2018  . Leukopenia 01/14/2018    Past Surgical History:  Procedure Laterality Date  . CHOLECYSTECTOMY    . GASTRIC BYPASS          Home Medications    Prior to Admission medications   Medication Sig Start Date End Date Taking? Authorizing Provider  amLODipine (NORVASC) 10 MG tablet Take 10 mg by mouth daily.   Yes [provider]  folic acid (FOLVITE) 1 MG tablet Take 1 tablet (1 mg total) by mouth daily. 01/17/18  Yes Meredeth IdeLama, Gagan S, MD  Menthol-Methyl Salicylate (MUSCLE RUB) 10-15 % CREA Apply 1 application topically as needed for muscle pain.   Yes [provider]  niacin 100 MG tablet Take 100 mg by mouth daily.   Yes [provider]  omeprazole (PRILOSEC OTC) 20 MG tablet Take 1 tablet (20 mg total) by mouth daily. 01/16/18 03/17/18 Yes Meredeth IdeLama, Gagan S, MD  thiamine 100 MG tablet Take 1 tablet (100 mg total) by mouth daily. 01/17/18  Yes Meredeth IdeLama, Gagan S, MD  vitamin B-12 (CYANOCOBALAMIN) 1000 MCG  tablet Take 1,000 mcg by mouth daily.   Yes [provider]  docusate sodium (COLACE) 100 MG capsule Take 1 capsule (100 mg total) by mouth 2 (two) times daily. Patient not taking: Reported on 02/17/2018 01/16/18   Meredeth IdeLama, Gagan S, MD  hydrocortisone (ANUSOL-HC) 25 MG suppository Place 1 suppository (25 mg total) rectally 2 (two) times daily. Patient not taking: Reported on 02/17/2018 01/16/18   Meredeth IdeLama, Gagan S, MD    Family History Family History  Problem Relation Age of Onset  . Leukemia Mother     Social History Social History   Tobacco Use  . Smoking status: Current Some Day Smoker    Types: Cigars  . Smokeless tobacco: Never Used  . Tobacco comment: black and mild cigars  Substance Use Topics  . Alcohol use: Yes    Comment: 3 pints liquor daily  . Drug use: Not Currently     Allergies   Oxycodone and Pollen extract   Review of Systems Review of Systems  Unable to perform ROS: Mental status change     Physical Exam Updated Vital Signs BP (!) 145/108 (BP Location: Left Arm)   Pulse (!) 112   Temp 99.8 F (37.7 C) (Oral)   Resp 18   Ht 5\' 10"  (1.778 m)   Wt 86.2 kg   SpO2 99%   BMI 27.26 kg/m   Physical Exam Vitals signs and nursing note reviewed.  Constitutional:  Appearance: He is well-developed.  HENT:     Head: Normocephalic and atraumatic.  Neck:     Musculoskeletal: Normal range of motion.  Cardiovascular:     Rate and Rhythm: Regular rhythm. Tachycardia present.     Heart sounds: Normal heart sounds.  Pulmonary:     Effort: Pulmonary effort is normal. No respiratory distress.     Breath sounds: Normal breath sounds.  Abdominal:     General: There is no distension.     Palpations: Abdomen is soft.     Tenderness: There is no abdominal tenderness.  Musculoskeletal: Normal range of motion.  Skin:    General: Skin is warm and dry.  Neurological:     Mental Status: He is alert.     Comments: Answer simple questions.  Follows  simple commands.  Appears to move all 4 extremities.  Psychiatric:        Attention and Perception: He is inattentive. He perceives visual hallucinations.        Thought Content: Thought content does not include homicidal or suicidal plan.      ED Treatments / Results  Labs (all labs ordered are listed, but only abnormal results are displayed) Labs Reviewed  CBC WITH DIFFERENTIAL/PLATELET - Abnormal; Notable for the following components:      Result Value   WBC 2.9 (*)    Lymphs Abs 0.6 (*)    All other components within normal limits  COMPREHENSIVE METABOLIC PANEL - Abnormal; Notable for the following components:   CO2 16 (*)    Calcium 8.3 (*)    AST 73 (*)    ALT 60 (*)    Total Bilirubin 3.3 (*)    Anion gap 23 (*)    All other components within normal limits  ETHANOL - Abnormal; Notable for the following components:   Alcohol, Ethyl (B) 140 (*)    All other components within normal limits  MAGNESIUM    EKG None  Radiology No results found.  Procedures .Critical Care Performed by: Azalia Bilisampos, Harol Shabazz, MD Authorized by: Azalia Bilisampos, Darice Vicario, MD   Critical care provider statement:    Critical care time (minutes):  33   Critical care was time spent personally by me on the following activities:  Discussions with consultants, evaluation of patient's response to treatment, examination of patient, ordering and performing treatments and interventions, ordering and review of laboratory studies, ordering and review of radiographic studies, pulse oximetry, re-evaluation of patient's condition, obtaining history from patient or surrogate and review of old charts   (including critical care time)  Medications Ordered in ED Medications  LORazepam (ATIVAN) injection 0-4 mg (has no administration in time range)    Or  LORazepam (ATIVAN) tablet 0-4 mg (has no administration in time range)  LORazepam (ATIVAN) injection 0-4 mg (has no administration in time range)    Or  LORazepam (ATIVAN)  tablet 0-4 mg (has no administration in time range)  dextrose 5 %-0.45 % sodium chloride infusion (has no administration in time range)  sodium chloride 0.9 % bolus 1,000 mL (0 mLs Intravenous Stopped 02/17/18 1820)  folic acid (FOLVITE) tablet 1 mg (1 mg Oral Given 02/17/18 1747)  LORazepam (ATIVAN) injection 2 mg (2 mg Intravenous Given 02/17/18 1723)  LORazepam (ATIVAN) tablet 1 mg (1 mg Oral Given 02/17/18 1905)  chlordiazePOXIDE (LIBRIUM) capsule 50 mg (50 mg Oral Given 02/17/18 1904)     Initial Impression / Assessment and Plan / ED Course  I have reviewed the triage vital signs and  the nursing notes.  Pertinent labs & imaging results that were available during my care of the patient were reviewed by me and considered in my medical decision making (see chart for details).    Appears to be hallucinating and have some degree of alcohol withdrawal.  Given Ativan thiamine and folic acid on arrival.  Additional IV fluids now.  Improving with benzodiazepines.  Labs demonstrate what appears to be alcoholic ketoacidosis with an elevated anion gap, low normal blood sugar, bicarb of 16.  Patient be started on D5 half-normal saline.  Admission for ongoing management of developing alcohol withdrawal and alcoholic ketoacidosis  Final Clinical Impressions(s) / ED Diagnoses   Final diagnoses:  Alcoholic ketoacidosis    ED Discharge Orders    None       Azalia Bilis, MD 02/17/18 1911

## 2018-02-17 NOTE — ED Notes (Signed)
Bed: [id number] Expected date:  Expected time:  Means of arrival:  Comments: EMS ETOH

## 2018-02-18 ENCOUNTER — Other Ambulatory Visit: Payer: Self-pay

## 2018-02-18 LAB — CBC
HCT: 35.3 % — ABNORMAL LOW (ref 39.0–52.0)
Hemoglobin: 11.6 g/dL — ABNORMAL LOW (ref 13.0–17.0)
MCH: 29.2 pg (ref 26.0–34.0)
MCHC: 32.9 g/dL (ref 30.0–36.0)
MCV: 88.9 fL (ref 80.0–100.0)
NRBC: 0 % (ref 0.0–0.2)
Platelets: 167 10*3/uL (ref 150–400)
RBC: 3.97 MIL/uL — AB (ref 4.22–5.81)
RDW: 13.3 % (ref 11.5–15.5)
WBC: 2.3 10*3/uL — ABNORMAL LOW (ref 4.0–10.5)

## 2018-02-18 LAB — TSH: TSH: 0.822 u[IU]/mL (ref 0.350–4.500)

## 2018-02-18 LAB — COMPREHENSIVE METABOLIC PANEL
ALT: 47 U/L — ABNORMAL HIGH (ref 0–44)
AST: 51 U/L — ABNORMAL HIGH (ref 15–41)
Albumin: 3.6 g/dL (ref 3.5–5.0)
Alkaline Phosphatase: 48 U/L (ref 38–126)
Anion gap: 9 (ref 5–15)
BUN: 12 mg/dL (ref 6–20)
CO2: 26 mmol/L (ref 22–32)
Calcium: 8.1 mg/dL — ABNORMAL LOW (ref 8.9–10.3)
Chloride: 103 mmol/L (ref 98–111)
Creatinine, Ser: 0.79 mg/dL (ref 0.61–1.24)
Glucose, Bld: 95 mg/dL (ref 70–99)
Potassium: 3.5 mmol/L (ref 3.5–5.1)
Sodium: 138 mmol/L (ref 135–145)
Total Bilirubin: 3.4 mg/dL — ABNORMAL HIGH (ref 0.3–1.2)
Total Protein: 6.3 g/dL — ABNORMAL LOW (ref 6.5–8.1)

## 2018-02-18 LAB — MAGNESIUM: MAGNESIUM: 1.7 mg/dL (ref 1.7–2.4)

## 2018-02-18 LAB — PHOSPHORUS: Phosphorus: 3 mg/dL (ref 2.5–4.6)

## 2018-02-18 LAB — MRSA PCR SCREENING: MRSA by PCR: NEGATIVE

## 2018-02-18 MED ORDER — NICOTINE 7 MG/24HR TD PT24
7.0000 mg | MEDICATED_PATCH | Freq: Every day | TRANSDERMAL | Status: DC
  Administered 2018-02-19: 7 mg via TRANSDERMAL
  Filled 2018-02-18 (×2): qty 1

## 2018-02-18 MED ORDER — HALOPERIDOL LACTATE 5 MG/ML IJ SOLN
2.0000 mg | Freq: Once | INTRAMUSCULAR | Status: AC
  Administered 2018-02-18: 2 mg via INTRAVENOUS
  Filled 2018-02-18: qty 1

## 2018-02-18 MED ORDER — HALOPERIDOL LACTATE 5 MG/ML IJ SOLN
INTRAMUSCULAR | Status: AC
  Filled 2018-02-18: qty 1

## 2018-02-18 MED ORDER — LITHIUM CARBONATE 300 MG PO CAPS
300.0000 mg | ORAL_CAPSULE | Freq: Two times a day (BID) | ORAL | Status: DC
  Administered 2018-02-18 – 2018-02-20 (×3): 300 mg via ORAL
  Filled 2018-02-18 (×7): qty 1

## 2018-02-18 MED ORDER — MAGNESIUM OXIDE 400 (241.3 MG) MG PO TABS
800.0000 mg | ORAL_TABLET | Freq: Every day | ORAL | Status: DC
  Administered 2018-02-18 – 2018-02-19 (×2): 800 mg via ORAL
  Filled 2018-02-18 (×3): qty 2

## 2018-02-18 MED ORDER — ADULT MULTIVITAMIN W/MINERALS CH
1.0000 | ORAL_TABLET | Freq: Every day | ORAL | Status: DC
  Administered 2018-02-18 – 2018-02-19 (×2): 1 via ORAL
  Filled 2018-02-18 (×2): qty 1

## 2018-02-18 MED ORDER — METOPROLOL TARTRATE 5 MG/5ML IV SOLN
5.0000 mg | Freq: Four times a day (QID) | INTRAVENOUS | Status: DC | PRN
  Administered 2018-02-18: 5 mg via INTRAVENOUS
  Filled 2018-02-18: qty 5

## 2018-02-18 MED ORDER — HALOPERIDOL LACTATE 5 MG/ML IJ SOLN
5.0000 mg | Freq: Once | INTRAMUSCULAR | Status: AC
  Administered 2018-02-18: 5 mg via INTRAVENOUS

## 2018-02-18 MED ORDER — CHLORDIAZEPOXIDE HCL 25 MG PO CAPS
50.0000 mg | ORAL_CAPSULE | Freq: Once | ORAL | Status: DC
  Filled 2018-02-18: qty 2

## 2018-02-18 MED ORDER — ENSURE ENLIVE PO LIQD
237.0000 mL | Freq: Two times a day (BID) | ORAL | Status: DC
  Administered 2018-02-18 – 2018-02-19 (×2): 237 mL via ORAL

## 2018-02-18 MED ORDER — ZOLPIDEM TARTRATE 5 MG PO TABS
5.0000 mg | ORAL_TABLET | Freq: Once | ORAL | Status: AC
  Administered 2018-02-18: 5 mg via ORAL
  Filled 2018-02-18: qty 1

## 2018-02-18 NOTE — Progress Notes (Signed)
Initial Nutrition Assessment  DOCUMENTATION CODES:   Not applicable  INTERVENTION:  - Will order Ensure Enlive BID, each supplement provides 350 kcal and 20 grams of protein. - Will order daily multivitamin with minerals. - Continue to encourage PO intakes.    NUTRITION DIAGNOSIS:   Increased nutrient needs related to acute illness as evidenced by estimated needs.  GOAL:   Patient will meet greater than or equal to 90% of their needs  MONITOR:   PO intake, Supplement acceptance, Weight trends, Labs  REASON FOR ASSESSMENT:   Malnutrition Screening Tool  ASSESSMENT:   47 y.o. male with medical history significant of alcohol abuse, HTN, bipolar disorder, anxiety, and prior cholecystectomy. He presented to the ED with withdrawal symptoms and stated his last alcoholic drink was this (12/30) morning and that withdrawal symptoms ( nausea, tremors, L abdominal pain) began 12/30 afternoon. He had been admitted in 12/2017 for alcohol withdrawal with DTs.  BMI indicates overweight status. No intakes documented since admission. Patient reports he was able to eat most of breakfast which consisted of scrambled eggs, sausage, and toast. He reports that stomach actually felt better after eating. He indicates that appetite was "non-existent" PTA and attributes this to alcohol use/abuse. He denies any chewing or swallowing difficulties.   Patient reports UBW of 212 lb but is unsure of the last time he weighed this and is unsure of when he may have started to lose weight. Per chart review, current weight is 190 lb which is what patient also weighed on 10/2. He weighed 195 lb on 3/21.   Medications reviewed; 1 mg Folvite/day, 800 mg Mag-ox/day, 100 mg thiamine/day, 1000 mcg oral cyanocobalamin/day.  Labs reviewed; Ca: 8.1 mg/dL. IVF; D5-1/2 NS @ 50 mL/hr (204 kcal).     NUTRITION - FOCUSED PHYSICAL EXAM:    Most Recent Value  Orbital Region  No depletion  Upper Arm Region  No depletion   Thoracic and Lumbar Region  Unable to assess  Buccal Region  No depletion  Temple Region  No depletion  Clavicle Bone Region  No depletion  Clavicle and Acromion Bone Region  No depletion  Scapular Bone Region  Unable to assess  Dorsal Hand  No depletion  Patellar Region  Unable to assess  Anterior Thigh Region  Unable to assess  Posterior Calf Region  Unable to assess  Edema (RD Assessment)  Unable to assess  Hair  Reviewed  Eyes  Reviewed  Mouth  Reviewed  Skin  Reviewed  Nails  Reviewed       Diet Order:   Diet Order            Diet regular Room service appropriate? Yes; Fluid consistency: Thin  Diet effective now              EDUCATION NEEDS:   No education needs have been identified at this time  Skin:  Skin Assessment: Reviewed RN Assessment  Last BM:  12/30  Height:   Ht Readings from Last 1 Encounters:  02/17/18 5\' 10"  (1.778 m)    Weight:   Wt Readings from Last 1 Encounters:  02/17/18 86.2 kg    Ideal Body Weight:  75.45 kg  BMI:  Body mass index is 27.26 kg/m.  Estimated Nutritional Needs:   Kcal:  1900-2150 kcal  Protein:  100-115 grams  Fluid:  >/= 2 L/day     Trenton GammonJessica Sahian Kerney, MS, RD, LDN, [facility] Inpatient Clinical Dietitian Pager # [phone] After hours/weekend pager # [phone]

## 2018-02-18 NOTE — ED Notes (Signed)
ED TO INPATIENT HANDOFF REPORT  ED Nurse Name and Phone #: Hughes BetterShanelle [phone]  Name/Age/Gender [name] 47 y.o. male Room/Bed: WA24/WA24  Code Status   Code Status: Full Code  Home/SNF/Other     Triage Complete: Triage complete  Chief Complaint ETOH  Triage Note Per EMS: Pt from home.  pts last drink was this am (unsure of time).  Pt began having withdrawal symptoms this afternoon.  C/o of nausea, tremors, L abdominal pain, and tremors.  Pt usually drinks about a 1.75 liters in 2 days.   Allergies Allergies  Allergen Reactions  . Oxycodone Anaphylaxis  . Pollen Extract Shortness Of Breath, Itching and Other (See Comments)    Runny nose, itchy eyes, sniffles    Level of Care/Admitting Diagnosis ED Disposition    ED Disposition Condition Comment   Admit  [facility] [[id number]]  Level of Care: Stepdown [14]  Admit to SDU based on following criteria: Respiratory Distress:  Frequent assessment and/or intervention to maintain adequate ventilation/respiration, pulmonary toilet, and respiratory treatment.  Admit to SDU based on following criteria: Severe physiological/psychological symptoms:  Any diagnosis requiring assessment & intervention at least every 4 hours on an ongoing basis to obtain desired patient outcomes including stability and rehabilitation  Diagnosis: Alcoholic ketoacidosis [098119][172225]  Admitting Physician: Therisa DoyneUTOVA, ANASTASSIA [[id number]]  Attending Physician: Therisa DoyneUTOVA, ANASTASSIA [[id number]]  Estimated length of stay: 3 - 4 days  Certification:: I certify this patient will need inpatient services for at least 2 midnights  PT Class (Do Not Modify): Inpatient [101]  PT Acc Code (Do Not Modify): Private [1]       Medical/Surgery History Past Medical History:  Diagnosis Date  . Hypertension    Past Surgical History:  Procedure Laterality Date  . CHOLECYSTECTOMY    . GASTRIC BYPASS       IV Location/Drains/Wounds Patient  Lines/Drains/Airways Status   Active Line/Drains/Airways    Name:   Placement date:   Placement time:   Site:   Days:   Peripheral IV 02/17/18 Right Forearm   02/17/18    -    Forearm   1          Intake/Output Last 24 hours  Intake/Output Summary (Last 24 hours) at 02/18/2018 0103 Last data filed at 02/17/2018 2330 Gross per 24 hour  Intake 1700 ml  Output 275 ml  Net 1425 ml    Labs/Imaging Results for orders placed or performed during the hospital encounter of 02/17/18 (from the past 48 hour(s))  CBC with Differential/Platelet     Status: Abnormal   Collection Time: 02/17/18  5:47 PM  Result Value Ref Range   WBC 2.9 (L) 4.0 - 10.5 K/uL   RBC 4.60 4.22 - 5.81 MIL/uL   Hemoglobin 13.2 13.0 - 17.0 g/dL   HCT 14.741.0 82.939.0 - 56.252.0 %   MCV 89.1 80.0 - 100.0 fL   MCH 28.7 26.0 - 34.0 pg   MCHC 32.2 30.0 - 36.0 g/dL   RDW 13.013.6 86.511.5 - 78.415.5 %   Platelets 224 150 - 400 K/uL   nRBC 0.0 0.0 - 0.2 %   Neutrophils Relative % 73 %   Neutro Abs 2.1 1.7 - 7.7 K/uL   Lymphocytes Relative 19 %   Lymphs Abs 0.6 (L) 0.7 - 4.0 K/uL   Monocytes Relative 7 %   Monocytes Absolute 0.2 0.1 - 1.0 K/uL   Eosinophils Relative 0 %   Eosinophils Absolute 0.0 0.0 - 0.5 K/uL  Basophils Relative 1 %   Basophils Absolute 0.0 0.0 - 0.1 K/uL   Immature Granulocytes 0 %   Abs Immature Granulocytes 0.00 0.00 - 0.07 K/uL    Comment: Performed at [facility], [address] [place], [place] [address]  Comprehensive metabolic panel     Status: Abnormal   Collection Time: 02/17/18  5:47 PM  Result Value Ref Range   Sodium 139 135 - 145 mmol/L   Potassium 3.7 3.5 - 5.1 mmol/L   Chloride 100 98 - 111 mmol/L   CO2 16 (L) 22 - 32 mmol/L   Glucose, Bld 70 70 - 99 mg/dL   BUN 14 6 - 20 mg/dL   Creatinine, Ser 6.040.70 0.61 - 1.24 mg/dL   Calcium 8.3 (L) 8.9 - 10.3 mg/dL   Total Protein 7.8 6.5 - 8.1 g/dL   Albumin 4.3 3.5 - 5.0 g/dL   AST 73 (H) 15 - 41 U/L   ALT 60 (H) 0 - 44 U/L    Alkaline Phosphatase 59 38 - 126 U/L   Total Bilirubin 3.3 (H) 0.3 - 1.2 mg/dL   GFR calc non Af Amer >60 >60 mL/min   GFR calc Af Amer >60 >60 mL/min   Anion gap 23 (H) 5 - 15    Comment: RESULT CHECKED Performed at [facility], [address] [place], [place] [address]   Ethanol     Status: Abnormal   Collection Time: 02/17/18  5:47 PM  Result Value Ref Range   Alcohol, Ethyl (B) 140 (H) <10 mg/dL    Comment: (NOTE) Lowest detectable limit for serum alcohol is 10 mg/dL. For medical purposes only. Performed at [facility], [address] [place], [place] [address]   Magnesium     Status: None   Collection Time: 02/17/18  5:47 PM  Result Value Ref Range   Magnesium 1.9 1.7 - 2.4 mg/dL    Comment: Performed at [facility], [address] [place], [place] [address]  Protime-INR     Status: None   Collection Time: 02/17/18  5:47 PM  Result Value Ref Range   Prothrombin Time 14.1 11.4 - 15.2 seconds   INR 1.10     Comment: Performed at [facility], [address] [place], [place] [address]  Beta-hydroxybutyric acid     Status: Abnormal   Collection Time: 02/17/18  7:37 PM  Result Value Ref Range   Beta-Hydroxybutyric Acid 3.65 (H) 0.05 - 0.27 mmol/L    Comment: Performed at [facility], [address] [place], [place] [address]  Blood gas, venous     Status: Abnormal   Collection Time: 02/17/18  8:24 PM  Result Value Ref Range   FIO2 21.00    pH, Ven 7.377 7.250 - 7.430   pCO2, Ven 32.7 (L) 44.0 - 60.0 mmHg   pO2, Ven 79.0 (H) 32.0 - 45.0 mmHg   Bicarbonate 18.7 (L) 20.0 - 28.0 mmol/L   Acid-base deficit 5.1 (H) 0.0 - 2.0 mmol/L   O2 Saturation 95.2 %   Patient temperature 98.6    Collection site VEIN    Drawn by COLLECTED BY NURSE    Sample type VENOUS     Comment: Performed at [facility], [address] [place], [place] [address]   No results  found.  Pending Labs [employer] Labs (From admission, onward)    Start     Ordered   02/18/18 0500  Magnesium  Tomorrow morning,   R    Comments:  Call MD if <1.5    02/17/18 2243   02/18/18 0500  Phosphorus  Tomorrow morning,   R     02/17/18 2243   02/18/18 0500  TSH  Once,   R    Comments:  Cancel if already done within 1 month and notify MD    02/17/18 2243   02/18/18 0500  Comprehensive metabolic panel  Once,   R    Comments:  Cal MD for K<3.5 or >5.0    02/17/18 2243   02/18/18 0500  CBC  Once,   R    Comments:  Call for hg <8.0    02/17/18 2243          Vitals/Pain Today's Vitals   02/17/18 2352 02/17/18 2354 02/18/18 0000 02/18/18 0100  BP:  (!) 140/101 (!) 135/93 (!) 136/95  Pulse:  (!) 124 (!) 113 (!) 115  Resp:   (!) 23 18  Temp:      TempSrc:      SpO2:   96% 99%  Weight:      Height:      PainSc: 7        Isolation Precautions No active isolations  Medications Medications  LORazepam (ATIVAN) injection 0-4 mg (2 mg Intravenous Given 02/17/18 2354)    Or  LORazepam (ATIVAN) tablet 0-4 mg ( Oral See Alternative 02/17/18 2354)  LORazepam (ATIVAN) injection 0-4 mg (has no administration in time range)    Or  LORazepam (ATIVAN) tablet 0-4 mg (has no administration in time range)  LORazepam (ATIVAN) injection 2-3 mg (has no administration in time range)  thiamine (VITAMIN B-1) tablet 100 mg (has no administration in time range)  folic acid (FOLVITE) tablet 1 mg (has no administration in time range)  dextrose 5 %-0.45 % sodium chloride infusion ( Intravenous New Bag/Given 02/17/18 2258)  amLODipine (NORVASC) tablet 10 mg (has no administration in time range)  vitamin B-12 (CYANOCOBALAMIN) tablet 1,000 mcg (has no administration in time range)  MUSCLE RUB CREA 1 application (1 application Topical Given 02/17/18 2357)  acetaminophen (TYLENOL) tablet 650 mg (has no administration in time range)    Or  acetaminophen (TYLENOL) suppository 650 mg (has no  administration in time range)  ondansetron (ZOFRAN) tablet 4 mg (has no administration in time range)    Or  ondansetron (ZOFRAN) injection 4 mg (has no administration in time range)  0.9 %  sodium chloride infusion ( Intravenous New Bag/Given 02/17/18 2259)  acetaminophen (TYLENOL) tablet 650 mg (650 mg Oral Given 02/17/18 2250)  sodium chloride 0.9 % bolus 1,000 mL (0 mLs Intravenous Stopped 02/17/18 1820)  folic acid (FOLVITE) tablet 1 mg (1 mg Oral Given 02/17/18 1747)  LORazepam (ATIVAN) injection 2 mg (2 mg Intravenous Given 02/17/18 1723)  LORazepam (ATIVAN) tablet 1 mg (1 mg Oral Given 02/17/18 1905)  chlordiazePOXIDE (LIBRIUM) capsule 50 mg (50 mg Oral Given 02/17/18 1904)  LORazepam (ATIVAN) injection 2 mg (2 mg Intravenous Given 02/17/18 2216)    Mobility walks Low fall risk   Focused Assessments   Recommendations: See Admitting Provider Note  Report given to:   Additional Notes:

## 2018-02-18 NOTE — Progress Notes (Signed)
Pt. Has scored 17 on CIWA x 2 at 1600 and 1800. 2mg  of ativan given x 2. ST in the 110's. MD made aware. Verbal for Librium 50 mg x 1. Instructions to give now and reassess. Will continue to monitor.

## 2018-02-18 NOTE — Progress Notes (Signed)
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, prior paramedic Known history of alcoholism Prior cholecystectomy Bipolar Hematochezia likely hemorrhoidal supposed to follow in GI office Recent admission 11/26-11/28 alcohol withdrawal, DTs  Came to emergency room with nausea vomiting and tremors and also left abdominal pain-usual daily drinker 1.7 L every 48 hourly Found to have AG acidosis 23 started on IV fluids in the emergency room    A & Plan Alcoholism with withdrawal-CIWA scores in the 18 range-still needs stepdown management-not hearing voices or having visual hallucinations at this time feels better than previously-monitor today on stepdown-obtain resources for alcoholism and will need outpatient psychiatry input HTN-uncontrolled-usually on amlodipine at home-adding low-dose IV metoprolol 5 mg every 6 as needed blood pressure >160-if persistent will add this to outpatient MAR-discontinue saline, cut back D5 to 50 cc an hour Bipolar-asking for outpatient taper of Ativan-mention that this is something we do not typically do-have also mentioned that I can give low doses of Ativan for anxiety as this is something that has been bothering him for a while and how he medicates himself is usually with alcohol.  Will start low-dose lithium 300 twice daily and graduate and may add low-dose quetiapine or lurasidone as an outpatient Mild obesity-outpatient management Hypomagnesemia-replace with potassium 800 mg today repeat labs a.m. Prior hematochezia supposed to follow with GI-we will inquire if he is seen Prior cholecystectomy     Mahala MenghiniSamtani, MD  [facility] Direct contact: [phone] --Via amion app OR  --[email or web address]; password [email or web address]  7PM-7AM contact night coverage as above 02/18/2018, 7:18 AM  LOS: 1 day    Interval history/Subjective: Awake alert but not very interactive  poor eye contact About to eat No nausea no vomiting No chest pain  Objective:  Vitals:  Vitals:   02/18/18 0300 02/18/18 0400  BP: (!) 151/106   Pulse: (!) 109   Resp: 19   Temp:  98.6 F (37 C)  SpO2: 99%     Exam:  EOMI NCAT, no icterus, neck soft supple no submandibular lymphadenopathy no thyromegaly Chest clinically clear no added sound Abdomen soft nontender no rebound no guarding No lower extremity edema Range of motion intact    I have personally reviewed the following:  DATA   Labs:  Sodium 138 BUN/creatinine 12/0.7  Magnesium 1.7 phosphorus 3.0 AST/ALT 51/47-(previously in 12/2017 levels were in the 300 range) total bilirubin 3.4 (prior 3.3   Imaging studies:  CXR is benign  Other data  CIWA ranging from 12-17   Scheduled Meds: . amLODipine  10 mg Oral Daily  . folic acid  1 mg Oral Daily  . LORazepam  0-4 mg Intravenous Q6H   Or  . LORazepam  0-4 mg Oral Q6H  . [START ON 02/19/2018] LORazepam  0-4 mg Intravenous Q12H   Or  . [START ON 02/19/2018] LORazepam  0-4 mg Oral Q12H  . thiamine  100 mg Oral Daily  . vitamin B-12  1,000 mcg Oral Daily   Continuous Infusions: . sodium chloride 75 mL/hr at 02/17/18 2322  . dextrose 5 % and 0.45% NaCl 100 mL/hr at 02/18/18 0354    Active Problems:   Alcohol withdrawal (HCC)   Essential hypertension   Leukopenia   Alcoholic ketoacidosis   LOS: 1 day

## 2018-02-19 ENCOUNTER — Inpatient Hospital Stay (HOSPITAL_COMMUNITY): Payer: BLUE CROSS/BLUE SHIELD

## 2018-02-19 DIAGNOSIS — G9341 Metabolic encephalopathy: Secondary | ICD-10-CM

## 2018-02-19 LAB — CBC WITH DIFFERENTIAL/PLATELET
Abs Immature Granulocytes: 0.01 10*3/uL (ref 0.00–0.07)
Basophils Absolute: 0 10*3/uL (ref 0.0–0.1)
Basophils Relative: 0 %
Eosinophils Absolute: 0 10*3/uL (ref 0.0–0.5)
Eosinophils Relative: 1 %
HCT: 37.8 % — ABNORMAL LOW (ref 39.0–52.0)
Hemoglobin: 11.8 g/dL — ABNORMAL LOW (ref 13.0–17.0)
Immature Granulocytes: 0 %
Lymphocytes Relative: 20 %
Lymphs Abs: 0.5 10*3/uL — ABNORMAL LOW (ref 0.7–4.0)
MCH: 29.4 pg (ref 26.0–34.0)
MCHC: 31.2 g/dL (ref 30.0–36.0)
MCV: 94.3 fL (ref 80.0–100.0)
Monocytes Absolute: 0.2 10*3/uL (ref 0.1–1.0)
Monocytes Relative: 6 %
Neutro Abs: 2 10*3/uL (ref 1.7–7.7)
Neutrophils Relative %: 73 %
PLATELETS: 139 10*3/uL — AB (ref 150–400)
RBC: 4.01 MIL/uL — ABNORMAL LOW (ref 4.22–5.81)
RDW: 13.4 % (ref 11.5–15.5)
WBC: 2.8 10*3/uL — ABNORMAL LOW (ref 4.0–10.5)
nRBC: 0 % (ref 0.0–0.2)

## 2018-02-19 LAB — COMPREHENSIVE METABOLIC PANEL
ALK PHOS: 44 U/L (ref 38–126)
ALT: 44 U/L (ref 0–44)
AST: 63 U/L — ABNORMAL HIGH (ref 15–41)
Albumin: 3.8 g/dL (ref 3.5–5.0)
Anion gap: 10 (ref 5–15)
BUN: 8 mg/dL (ref 6–20)
CO2: 26 mmol/L (ref 22–32)
CREATININE: 0.71 mg/dL (ref 0.61–1.24)
Calcium: 8.8 mg/dL — ABNORMAL LOW (ref 8.9–10.3)
Chloride: 106 mmol/L (ref 98–111)
GFR calc Af Amer: >60 mL/min (ref >60)
GFR calc non Af Amer: >60 mL/min (ref >60)
Glucose, Bld: 102 mg/dL — ABNORMAL HIGH (ref 70–99)
Potassium: 3.9 mmol/L (ref 3.5–5.1)
Sodium: 142 mmol/L (ref 135–145)
Total Bilirubin: 2.9 mg/dL — ABNORMAL HIGH (ref 0.3–1.2)
Total Protein: 6.7 g/dL (ref 6.5–8.1)

## 2018-02-19 LAB — AMYLASE: Amylase: 45 U/L (ref 28–100)

## 2018-02-19 LAB — MAGNESIUM: Magnesium: 2.3 mg/dL (ref 1.7–2.4)

## 2018-02-19 LAB — LIPASE, BLOOD: Lipase: 34 U/L (ref 11–51)

## 2018-02-19 MED ORDER — CHLORDIAZEPOXIDE HCL 10 MG PO CAPS
10.0000 mg | ORAL_CAPSULE | Freq: Three times a day (TID) | ORAL | Status: DC
  Administered 2018-02-19 (×2): 10 mg via ORAL
  Filled 2018-02-19 (×4): qty 1

## 2018-02-19 MED ORDER — HALOPERIDOL LACTATE 5 MG/ML IJ SOLN
5.0000 mg | Freq: Once | INTRAMUSCULAR | Status: AC
  Administered 2018-02-19: 5 mg via INTRAVENOUS
  Filled 2018-02-19: qty 1

## 2018-02-19 MED ORDER — DEXMEDETOMIDINE HCL IN NACL 200 MCG/50ML IV SOLN
0.4000 ug/kg/h | INTRAVENOUS | Status: DC
  Administered 2018-02-19: 0.5 ug/kg/h via INTRAVENOUS
  Administered 2018-02-19: 0.4 ug/kg/h via INTRAVENOUS
  Filled 2018-02-19 (×3): qty 50

## 2018-02-19 MED ORDER — LORAZEPAM 2 MG/ML IJ SOLN
2.0000 mg | Freq: Four times a day (QID) | INTRAMUSCULAR | Status: DC
  Administered 2018-02-19: 2 mg via INTRAVENOUS
  Filled 2018-02-19: qty 1

## 2018-02-19 MED ORDER — LORAZEPAM 2 MG/ML IJ SOLN
2.0000 mg | INTRAMUSCULAR | Status: DC | PRN
  Administered 2018-02-19 – 2018-02-20 (×3): 2 mg via INTRAVENOUS
  Filled 2018-02-19 (×4): qty 1

## 2018-02-19 NOTE — Progress Notes (Signed)
Pt increasingly agitated and restless. CIWA scores between 24-28. Gait incredibly unstable. Multiple attempts to get out of bed, despite bed alarm and attempts to verbally redirect.  Soft restraints ordered and in place.

## 2018-02-19 NOTE — Progress Notes (Signed)
eLink Physician-Brief Progress Note Patient Name: [name] DOB: 31-Aug-1970 MRN: [id number]   Date of Service  02/19/2018  HPI/Events of Note  Multiple issues: 1. Patient c/o Abdominal pain and 2. Elevated AST - Patient is on Tylenol PRN.  eICU Interventions  Will order: 1. D/C Tylenol PRN.  2. Portable Abdominal film STAT.  3. CBC with Platelets, amylase and lipase STAT. 4. No pain medication until etiology of abdominal pain is better defined.  5. UA with reflex microscopic now.      Intervention Category Major Interventions: Other:  Lenell Antu 02/19/2018, 9:10 PM

## 2018-02-19 NOTE — Progress Notes (Signed)
Shift event: Pt has had a CIWA score of 26-28 in spite of 13mg  Ativan and 10mg  Haldol since 1900 hrs. On last call from RN, he has managed to get out of his restraints and is agitated. NP called PCCM and Dr. Dellie Catholic will place Precedex orders and send someone to see the pt in consult. KJKG, NP [facility]

## 2018-02-19 NOTE — Consult Note (Signed)
NAME:  [name], MRN:  [id number], DOB:  October 17, 1970, LOS: 2 ADMISSION DATE:  02/17/2018, CONSULTATION DATE:  02/19/2018 REFERRING MD:  Dr. Mahala Menghini , CHIEF COMPLAINT:  Refractory alcohol withdrawal   Brief History   48 year old male admitted 12/30 for alcohol withdrawal symptoms. Agitation has been refractory to repeated doses of ativan and haldol. Started on Precedex infusion and PCCM consulted.   History of present illness   47 year old male with PMH as below, which is significant for alcohol abuse "1.75 L every two days" and bipolar disorder.  He was recently admitted 11/26 - 28th with DTs. Last drink was 12/30 AM, and by the afternoon he was already experiencing the shakes, nausea, and abdominal pain. In ED he was found to be acidotic, which was felt to be secondary to alcoholic ketoacidosis. He was admitted to the hospitalists and treated with benzodiazepines, dextrose IVF, thiamine, and folate. Seemed to be managed pretty well initially, until the early AM hours of 1/1 when he remained very agitated and ripping IVs out despite 13mg  ativan and 10mg  haldol. He was started on precedex infusion and PCCM was consulted.   Past Medical History   has a past medical history of Hypertension. ETOH abuse   Significant Hospital Events     Consults:    Procedures:    Significant Diagnostic Tests:    Micro Data:    Antimicrobials:    Interim history/subjective:    Objective   Blood pressure (!) 161/131, pulse (!) 102, temperature 98.5 F (36.9 C), temperature source Oral, resp. rate 18, height 5\' 10"  (1.778 m), weight 86.2 kg, SpO2 100 %.        Intake/Output Summary (Last 24 hours) at 02/19/2018 0554 Last data filed at 02/19/2018 0222 Gross per 24 hour  Intake 2098.74 ml  Output 2910 ml  Net -811.26 ml   Filed Weights   02/17/18 1726  Weight: 86.2 kg    Examination: General: Adult male restrained in bed.  HENT: [place]/AT, PERRL, no JVD Lungs: In no respiratory distress.  Breathing comfortably. Cardiovascular: Tachy, regular, no MRG Abdomen: Non-distended Extremities: No acute deformity or ROM limitation Neuro: Alert but disoriented. Agitated and confused.   Resolved Hospital Problem list   Alcoholic ketoacidosis.   Assessment & Plan:   Delirium tremens due to alcohol withdrawal: Last drink 12/30 AM. Agitaiton and combativeness has been refractory to repeated doses of ativan and haldol. Started on Precedex 1/1 early AM. - Continue precedex infusion for RASS goal -1 - Scheduled and PRN ativan for agitation.  - Thiamine, folate, MVI  Hypertension: remain hypertensive despite norvasc PO and metoprolol IV.  - Continue norvasc and PRN metoprolol. - Will see how he responds to precedex before starting anything further.   Bipolar disorder: started on low dose lithium this admission by hospitalist.  - Continue lithium so long as he can safely take POs.  - Follow lithium level.   Hypomag: improved - monitor  Hematochezia: felt to be due to hemerrhoids - monitor - Hold  Chemoprophylaxis for VTE, SCDs only for now.  Best practice:  Diet: heart healthy Pain/Anxiety/Delirium protocol (if indicated): precedex for RASS goal -1 VAP protocol (if indicated): na DVT prophylaxis: SCDs GI prophylaxis: na Glucose control: na Mobility: BR Code Status: FULL Family Communication: Girlfriend is bedside and was updated.  Disposition: ICU  Labs   CBC: Recent Labs  Lab 02/17/18 1747 02/18/18 0338  WBC 2.9* 2.3*  NEUTROABS 2.1  --   HGB 13.2  11.6*  HCT 41.0 35.3*  MCV 89.1 88.9  PLT 224 167    Basic Metabolic Panel: Recent Labs  Lab 02/17/18 1747 02/18/18 0338 02/19/18 0331  NA 139 138 142  K 3.7 3.5 3.9  CL 100 103 106  CO2 16* 26 26  GLUCOSE 70 95 102*  BUN 14 12 8   CREATININE 0.70 0.79 0.71  CALCIUM 8.3* 8.1* 8.8*  MG 1.9 1.7 2.3  PHOS  --  3.0  --    GFR: Estimated Creatinine Clearance: 117.9 mL/min (by C-G formula based on SCr of 0.71  mg/dL). Recent Labs  Lab 02/17/18 1747 02/18/18 0338  WBC 2.9* 2.3*    Liver Function Tests: Recent Labs  Lab 02/17/18 1747 02/18/18 0338 02/19/18 0331  AST 73* 51* 63*  ALT 60* 47* 44  ALKPHOS 59 48 44  BILITOT 3.3* 3.4* 2.9*  PROT 7.8 6.3* 6.7  ALBUMIN 4.3 3.6 3.8   No results for input(s): LIPASE, AMYLASE in the last 168 hours. No results for input(s): AMMONIA in the last 168 hours.  ABG    Component Value Date/Time   HCO3 18.7 (L) 02/17/2018 2024   ACIDBASEDEF 5.1 (H) 02/17/2018 2024   O2SAT 95.2 02/17/2018 2024     Coagulation Profile: Recent Labs  Lab 02/17/18 1747  INR 1.10    Cardiac Enzymes: No results for input(s): CKTOTAL, CKMB, CKMBINDEX, TROPONINI in the last 168 hours.  HbA1C: No results found for: HGBA1C  CBG: No results for input(s): GLUCAP in the last 168 hours.  Review of Systems:   Limited by encephalopahty  Past Medical History  He,  has a past medical history of Hypertension.   Surgical History    Past Surgical History:  Procedure Laterality Date  . CHOLECYSTECTOMY    . GASTRIC BYPASS       Social History   reports that he has been smoking cigars. He has never used smokeless tobacco. He reports current alcohol use. He reports previous drug use.   Family History   His family history includes Leukemia in his mother.   Allergies Allergies  Allergen Reactions  . Oxycodone Anaphylaxis  . Pollen Extract Shortness Of Breath, Itching and Other (See Comments)    Runny nose, itchy eyes, sniffles     Home Medications  Prior to Admission medications   Medication Sig Start Date End Date Taking? Authorizing Provider  amLODipine (NORVASC) 10 MG tablet Take 10 mg by mouth daily.   Yes [provider]  folic acid (FOLVITE) 1 MG tablet Take 1 tablet (1 mg total) by mouth daily. 01/17/18  Yes Meredeth IdeLama, Gagan S, MD  Menthol-Methyl Salicylate (MUSCLE RUB) 10-15 % CREA Apply 1 application topically as needed for muscle pain.   Yes  [provider]  niacin 100 MG tablet Take 100 mg by mouth daily.   Yes [provider]  omeprazole (PRILOSEC OTC) 20 MG tablet Take 1 tablet (20 mg total) by mouth daily. 01/16/18 03/17/18 Yes Meredeth IdeLama, Gagan S, MD  thiamine 100 MG tablet Take 1 tablet (100 mg total) by mouth daily. 01/17/18  Yes Meredeth IdeLama, Gagan S, MD  vitamin B-12 (CYANOCOBALAMIN) 1000 MCG tablet Take 1,000 mcg by mouth daily.   Yes [provider]  docusate sodium (COLACE) 100 MG capsule Take 1 capsule (100 mg total) by mouth 2 (two) times daily. Patient not taking: Reported on 02/17/2018 01/16/18   Meredeth IdeLama, Gagan S, MD  hydrocortisone (ANUSOL-HC) 25 MG suppository Place 1 suppository (25 mg total) rectally 2 (two)  times daily. Patient not taking: Reported on 02/17/2018 01/16/18   Meredeth IdeLama, Gagan S, MD     Critical care time: 30 mins     Joneen RoachPaul Hoffman, AGACNP-BC [facility] Pager [phone] or [phone]  02/19/2018 6:22 AM

## 2018-02-19 NOTE — Progress Notes (Signed)
LCSW consulted for SA.  Patient recently accessed and given SA resources on 01/16/18. No significant changes since last assessment.   LCSW signing off. No CSW needs.   [name], LSCW [facility] [phone]

## 2018-02-20 LAB — COMPREHENSIVE METABOLIC PANEL
ALT: 98 U/L — ABNORMAL HIGH (ref 0–44)
AST: 156 U/L — ABNORMAL HIGH (ref 15–41)
Albumin: 4.1 g/dL (ref 3.5–5.0)
Alkaline Phosphatase: 52 U/L (ref 38–126)
Anion gap: 10 (ref 5–15)
BUN: 6 mg/dL (ref 6–20)
CO2: 24 mmol/L (ref 22–32)
Calcium: 8.7 mg/dL — ABNORMAL LOW (ref 8.9–10.3)
Chloride: 106 mmol/L (ref 98–111)
Creatinine, Ser: 0.78 mg/dL (ref 0.61–1.24)
GFR calc Af Amer: >60 mL/min (ref >60)
Glucose, Bld: 164 mg/dL — ABNORMAL HIGH (ref 70–99)
Potassium: 3.2 mmol/L — ABNORMAL LOW (ref 3.5–5.1)
Sodium: 140 mmol/L (ref 135–145)
Total Bilirubin: 2.4 mg/dL — ABNORMAL HIGH (ref 0.3–1.2)
Total Protein: 7.1 g/dL (ref 6.5–8.1)

## 2018-02-20 LAB — URINALYSIS, ROUTINE W REFLEX MICROSCOPIC
Bilirubin Urine: NEGATIVE
Glucose, UA: NEGATIVE mg/dL
Hgb urine dipstick: NEGATIVE
Ketones, ur: NEGATIVE mg/dL
Leukocytes, UA: NEGATIVE
Nitrite: NEGATIVE
PROTEIN: NEGATIVE mg/dL
Specific Gravity, Urine: 1.008 (ref 1.005–1.030)
pH: 9 — ABNORMAL HIGH (ref 5.0–8.0)

## 2018-02-20 LAB — CBC WITH DIFFERENTIAL/PLATELET
Abs Immature Granulocytes: 0.01 10*3/uL (ref 0.00–0.07)
BASOS ABS: 0 10*3/uL (ref 0.0–0.1)
Basophils Relative: 0 %
Eosinophils Absolute: 0 10*3/uL (ref 0.0–0.5)
Eosinophils Relative: 1 %
HCT: 39.5 % (ref 39.0–52.0)
Hemoglobin: 12.4 g/dL — ABNORMAL LOW (ref 13.0–17.0)
Immature Granulocytes: 0 %
Lymphocytes Relative: 22 %
Lymphs Abs: 0.6 10*3/uL — ABNORMAL LOW (ref 0.7–4.0)
MCH: 29 pg (ref 26.0–34.0)
MCHC: 31.4 g/dL (ref 30.0–36.0)
MCV: 92.3 fL (ref 80.0–100.0)
Monocytes Absolute: 0.2 10*3/uL (ref 0.1–1.0)
Monocytes Relative: 7 %
NRBC: 0 % (ref 0.0–0.2)
Neutro Abs: 2 10*3/uL (ref 1.7–7.7)
Neutrophils Relative %: 70 %
Platelets: 140 10*3/uL — ABNORMAL LOW (ref 150–400)
RBC: 4.28 MIL/uL (ref 4.22–5.81)
RDW: 13.6 % (ref 11.5–15.5)
WBC: 2.8 10*3/uL — AB (ref 4.0–10.5)

## 2018-02-20 LAB — MAGNESIUM: Magnesium: 2.1 mg/dL (ref 1.7–2.4)

## 2018-02-20 LAB — PHOSPHORUS: Phosphorus: 2.8 mg/dL (ref 2.5–4.6)

## 2018-02-20 LAB — LITHIUM LEVEL

## 2018-02-20 MED ORDER — ZOLPIDEM TARTRATE 5 MG PO TABS
5.0000 mg | ORAL_TABLET | Freq: Once | ORAL | Status: AC
  Administered 2018-02-20: 5 mg via ORAL
  Filled 2018-02-20: qty 1

## 2018-02-20 NOTE — Progress Notes (Signed)
LCSW consulted for SA after signing off.   LCSW provided patient with SA resources. Patient reports that he still has resources from previous admission and has been calling around to find treatment as well as therapy resources.   Plan: Patient will follow up with resources on his own.   Beulah Gandy [facility] CSW [phone]

## 2018-02-20 NOTE — Progress Notes (Signed)
eLink Physician-Brief Progress Note Patient Name: [name] DOB: 10-24-1970 MRN: [id number]   Date of Service  02/20/2018  HPI/Events of Note  Multiple issues: 1. CBC, amylase, lipase, abdominal x-ray all unremarkable and 2. Patient wants sleep aid.   eICU Interventions  Will order: 1. Ambien 5 mg PO X 1 now.      Intervention Category Major Interventions: Other:  Genevieve Arbaugh Dennard Nip 02/20/2018, 1:24 AM

## 2018-02-20 NOTE — Progress Notes (Signed)
Patient restless/agitated.  He stated he wanted to leave and that the doctor had until 11:45 a.m. to see him before he left.  CCM physician, Dr. Tonia BroomsIcard, saw him this morning and talked with him at length.  Patient does not recall this conversation.  I [facility]card, [facility]  He advised to page the NP, Jovita KussmaulKatalina Eubanks, regarding the situation.  Paged NP Janyth ContesEubanks, who quickly came to see the patient.  She talked with him, along with myself and department director, Antony OdeaGayle Mueller.  NP wanted patient to stay until 5 p.m. for further observation and an appropriate discharge.  Patient declined this and chose to leave AMA.  Patient signed AMA forms and was accompanied downstairs by NP Eubanks.

## 2018-02-20 NOTE — Care Management Note (Signed)
Case Management Note  Patient Details  Name: [name] MRN: [id number] Date of Birth: 12/14/1970  Subjective/Objective:                  Alcohol resources for cessation  Action/Plan: Referral to csw for resources done.  Expected Discharge Date:  (unknown)               Expected Discharge Plan:     In-House Referral:     Discharge planning Services     Post Acute Care Choice:    Choice offered to:     DME Arranged:    DME Agency:     HH Arranged:    HH Agency:     Status of Service:     If discussed at [employer], dates discussed:    Additional Comments:  Golda Acre, RN 02/20/2018, 8:34 AM

## 2018-02-20 NOTE — Consult Note (Addendum)
NAME:  [name], MRN:  [id number], DOB:  December 23, 1970, LOS: 3 ADMISSION DATE:  02/17/2018, CONSULTATION DATE:  02/19/2018 REFERRING MD:  Dr. Mahala Menghini , CHIEF COMPLAINT:  Refractory alcohol withdrawal   Brief History   48 year old male admitted 12/30 for alcohol withdrawal symptoms. Agitation has been refractory to repeated doses of ativan and haldol. Started on Precedex infusion and PCCM consulted.   History of present illness   47 year old male with PMH as below, which is significant for alcohol abuse "1.75 L every two days" and bipolar disorder.  He was recently admitted 11/26 - 28th with DTs. Last drink was 12/30 AM, and by the afternoon he was already experiencing the shakes, nausea, and abdominal pain. In ED he was found to be acidotic, which was felt to be secondary to alcoholic ketoacidosis. He was admitted to the hospitalists and treated with benzodiazepines, dextrose IVF, thiamine, and folate. Seemed to be managed pretty well initially, until the early AM hours of 1/1 when he remained very agitated and ripping IVs out despite 13mg  ativan and 10mg  haldol. He was started on precedex infusion and PCCM was consulted.   Past Medical History   has a past medical history of Hypertension. ETOH abuse   Significant Hospital Events     Consults:    Procedures:    Significant Diagnostic Tests:    Micro Data:    Antimicrobials:    Interim history/subjective:  Patient doing very well this morning.  No issues overnight.  Very comfortable taking Librium dosing.  Patient asking when he can go home.  Objective   Blood pressure (!) 137/95, pulse 96, temperature 98.3 F (36.8 C), temperature source Oral, resp. rate (!) 23, height 5\' 10"  (1.778 m), weight 86.2 kg, SpO2 100 %.        Intake/Output Summary (Last 24 hours) at 02/20/2018 0845 Last data filed at 02/20/2018 0344 Gross per 24 hour  Intake 962.89 ml  Output 1275 ml  Net -312.11 ml   Filed Weights   02/17/18 1726  Weight:  86.2 kg    Examination: General: Young male, no apparent distress, sitting up in bed reading on his phone glasses on, watching TV HENT: CAT, sclera clear, pupils reactive, no jaundice Neck: No JVD, trachea midline Lungs: Clear to auscultation bilaterally, no crackles, no wheeze Cardiovascular: Regular rate and rhythm S1, S2 no MRG Abdomen: Soft, nontender, nondistended, bowel sounds present Extremities: No edema Neuro: Alert and oriented, following commands, no focal deficit  Resolved Hospital Problem list   Alcoholic ketoacidosis.   Assessment & Plan:   Acute alcohol withdrawal with delirium tremens, acute metabolic encephalopathy secondary to above -Off Precedex since yesterday.  Orders have been discontinued -Continue Librium 10 mg 3 times daily -Continue PRN Ativan with CIWA scoring. - Continue multivitamin and folate and thiamine  Leukopenia - Likely from ongoing alcohol use. -We will continue observe -HIV negative screen in November 2019  Mild transaminitis -Likely from alcohol, mild alcoholic hepatitis -No indication for steroid.  Hypertension:  -Continue Norvasc and as needed metoprolol.  Bipolar disorder: -Continue lithium -This was started by hospitalist on admission. -We will check lithium level.  Hypomag: improved We will continue to monitor  Hematochezia:  -Felt to be related to hemorrhoids. -We will continue to follow no evidence of active bleeding.  I have spoke with Dr. Mahala Menghini who has agreed to take patient to the [facility] hospitalist service as of 02/21/2018.  Best practice:  Diet: heart healthy Pain/Anxiety/Delirium protocol (if  indicated): na  VAP protocol (if indicated): na DVT prophylaxis: SCDs GI prophylaxis: na Glucose control: na Mobility: BR Code Status: FULL Family Communication: no family at bedside today  Disposition: Med-Surg   Labs   CBC: Recent Labs  Lab 02/17/18 1747 02/18/18 0338 02/19/18 2123  WBC 2.9* 2.3* 2.8*    NEUTROABS 2.1  --  2.0  HGB 13.2 11.6* 11.8*  HCT 41.0 35.3* 37.8*  MCV 89.1 88.9 94.3  PLT 224 167 139*    Basic Metabolic Panel: Recent Labs  Lab 02/17/18 1747 02/18/18 0338 02/19/18 0331  NA 139 138 142  K 3.7 3.5 3.9  CL 100 103 106  CO2 16* 26 26  GLUCOSE 70 95 102*  BUN 14 12 8   CREATININE 0.70 0.79 0.71  CALCIUM 8.3* 8.1* 8.8*  MG 1.9 1.7 2.3  PHOS  --  3.0  --    GFR: Estimated Creatinine Clearance: 117.9 mL/min (by C-G formula based on SCr of 0.71 mg/dL). Recent Labs  Lab 02/17/18 1747 02/18/18 0338 02/19/18 2123  WBC 2.9* 2.3* 2.8*    Liver Function Tests: Recent Labs  Lab 02/17/18 1747 02/18/18 0338 02/19/18 0331  AST 73* 51* 63*  ALT 60* 47* 44  ALKPHOS 59 48 44  BILITOT 3.3* 3.4* 2.9*  PROT 7.8 6.3* 6.7  ALBUMIN 4.3 3.6 3.8   Recent Labs  Lab 02/19/18 2123  LIPASE 34  AMYLASE 45   No results for input(s): AMMONIA in the last 168 hours.  ABG    Component Value Date/Time   HCO3 18.7 (L) 02/17/2018 2024   ACIDBASEDEF 5.1 (H) 02/17/2018 2024   O2SAT 95.2 02/17/2018 2024     Coagulation Profile: Recent Labs  Lab 02/17/18 1747  INR 1.10    Cardiac Enzymes: No results for input(s): CKTOTAL, CKMB, CKMBINDEX, TROPONINI in the last 168 hours.  HbA1C: No results found for: HGBA1C  CBG: No results for input(s): GLUCAP in the last 168 hours.  Josephine Igo, DO [facility] 02/20/2018 8:46 AM  Personal pager: [phone] If unanswered, please page CCM On-call: #[phone]

## 2018-02-20 NOTE — Progress Notes (Signed)
PCCM Interval Note  Called to bedside as patient is stating he is leaving. Spoke with him at length regarding his care and how we do not feel like he is medically ready for discharge. Asked patient to wait until this afternoon or until the morning for we can safely discharge him with his prescriptions and a follow up appointment. Patient states he is leaving and understands that this is against medical advice.   Jovita Kussmaul, AGACNP-BC [facility]  Pgr: [phone]  PCCM Pgr: [phone]

## 2018-02-27 ENCOUNTER — Ambulatory Visit: Payer: BLUE CROSS/BLUE SHIELD | Admitting: Gastroenterology

## 2018-03-09 NOTE — Discharge Summary (Signed)
Physician Discharge Summary   Patient ID: [name] [id number] 48 y.o. 1970-12-28  Admit date: 02/17/2018  Discharge date and time: 02/20/2018  1:43 PM   Admitting Physician: Therisa Doyne, MD   Discharge Physician: Dr. Tonia Brooms  Admission Diagnoses: Alcoholic ketoacidosis [E87.2]  Discharge Diagnoses: ETOH Withdrawal  Admission Condition: fair  Discharged Condition: fair  Indication for Admission: ETOH Withdrawal   Hospital Course: 48 year old male presents 12/30 with nausea, vomiting, and abdominal pain. Recently admitted 11/26-11/28 for detox. Has tried to stop drinking, typically drinks 1.75 L every two days. Last drink was 12/30 AM. On arrival to ED found to be acidotic secondary to alcoholic ketoacidosis. Treated with Benzodiazepines, Thiamine, and Folic Acid. 1/1 patient became very agitated and delirious requiring transfer to ICU for for Precedex gtt. Shortly after was able to transition to Librium. However, 1/2 patient decided to leave AMA. After speaking at length to the patient regarding his care and how we medically did not feel like he was ready for discharge patient decided he would leave AMA. Nursing Unit Manager and Staff Nurse also at bedside.   Discharge Exam: BP (!) 156/101   Pulse (!) 120   Temp 99.1 F (37.3 C) (Oral)   Resp (!) 21   Ht 5\' 10"  (1.778 m)   Wt 86.2 kg   SpO2 99%   BMI 27.26 kg/m   General Appearance:    Alert, cooperative, no distress, appears stated age  Head:    Normocephalic, without obvious abnormality, atraumatic  Eyes:    PERRL, conjunctiva/corneas clear, EOM's intact, fundi    benign, both eyes       Ears:    Normal TM's and external ear canals, both ears  Nose:   Nares normal, septum midline, mucosa normal, no drainage    or sinus tenderness  Throat:   Lips, mucosa, and tongue normal; teeth and gums normal  Neck:   Supple, symmetrical, trachea midline, no adenopathy;       thyroid:  No enlargement/tenderness/nodules; no  carotid   bruit or JVD  Back:     Symmetric, no curvature, ROM normal, no CVA tenderness  Lungs:     Clear to auscultation bilaterally, respirations unlabored  Chest wall:    No tenderness or deformity  Heart:    Regular rate and rhythm, S1 and S2 normal, no murmur, rub   or gallop  Abdomen:     Soft, non-tender, bowel sounds active all four quadrants,    no masses, no organomegaly  Genitalia:    Normal male without lesion, discharge or tenderness  Rectal:    Normal tone, normal prostate, no masses or tenderness;   guaiac negative stool  Extremities:   Extremities normal, atraumatic, no cyanosis or edema  Pulses:   2+ and symmetric all extremities  Skin:   Skin color, texture, turgor normal, no rashes or lesions  Lymph nodes:   Cervical, supraclavicular, and axillary nodes normal  Neurologic:   CNII-XII intact. Normal strength, sensation and reflexes      throughout    Disposition:   Patient Instructions:  Allergies as of 02/20/2018      Reactions   Oxycodone Anaphylaxis   Pollen Extract Shortness Of Breath, Itching, Other (See Comments)   Runny nose, itchy eyes, sniffles      Medication List    ASK your doctor about these medications   amLODipine 10 MG tablet Commonly known as:  NORVASC Take 10 mg by mouth daily.   docusate sodium  100 MG capsule Commonly known as:  COLACE Take 1 capsule (100 mg total) by mouth 2 (two) times daily.   folic acid 1 MG tablet Commonly known as:  FOLVITE Take 1 tablet (1 mg total) by mouth daily.   hydrocortisone 25 MG suppository Commonly known as:  ANUSOL-HC Place 1 suppository (25 mg total) rectally 2 (two) times daily.   MUSCLE RUB 10-15 % Crea Apply 1 application topically as needed for muscle pain.   niacin 100 MG tablet Take 100 mg by mouth daily.   omeprazole 20 MG tablet Commonly known as:  PRILOSEC OTC Take 1 tablet (20 mg total) by mouth daily.   thiamine 100 MG tablet Take 1 tablet (100 mg total) by mouth daily.    vitamin B-12 1000 MCG tablet Commonly known as:  CYANOCOBALAMIN Take 1,000 mcg by mouth daily.       Signed: Tobey Grim 03/09/2018 3:06 PM

## 2018-04-16 ENCOUNTER — Ambulatory Visit (HOSPITAL_COMMUNITY)
Admission: RE | Admit: 2018-04-16 | Discharge: 2018-04-16 | Disposition: A | Discharge status: Home / Self Care | Payer: Self-pay | Attending: Psychiatry | Admitting: Psychiatry

## 2018-04-16 ENCOUNTER — Other Ambulatory Visit: Payer: Self-pay

## 2018-04-16 ENCOUNTER — Encounter (HOSPITAL_COMMUNITY): Payer: Self-pay | Admitting: Emergency Medicine

## 2018-04-16 ENCOUNTER — Emergency Department (HOSPITAL_COMMUNITY)
Admission: EM | Admit: 2018-04-16 | Discharge: 2018-04-17 | Disposition: A | Discharge status: Home / Self Care | Payer: BLUE CROSS/BLUE SHIELD | Attending: Emergency Medicine | Admitting: Emergency Medicine

## 2018-04-16 DIAGNOSIS — R45851 Suicidal ideations: Secondary | ICD-10-CM | POA: Diagnosis not present

## 2018-04-16 DIAGNOSIS — F17290 Nicotine dependence, other tobacco product, uncomplicated: Secondary | ICD-10-CM | POA: Insufficient documentation

## 2018-04-16 DIAGNOSIS — F329 Major depressive disorder, single episode, unspecified: Secondary | ICD-10-CM | POA: Diagnosis not present

## 2018-04-16 DIAGNOSIS — F99 Mental disorder, not otherwise specified: Secondary | ICD-10-CM | POA: Diagnosis present

## 2018-04-16 DIAGNOSIS — F10239 Alcohol dependence with withdrawal, unspecified: Secondary | ICD-10-CM | POA: Diagnosis present

## 2018-04-16 DIAGNOSIS — Z9049 Acquired absence of other specified parts of digestive tract: Secondary | ICD-10-CM | POA: Diagnosis not present

## 2018-04-16 DIAGNOSIS — I10 Essential (primary) hypertension: Secondary | ICD-10-CM | POA: Insufficient documentation

## 2018-04-16 DIAGNOSIS — Z9884 Bariatric surgery status: Secondary | ICD-10-CM | POA: Diagnosis not present

## 2018-04-16 DIAGNOSIS — F10220 Alcohol dependence with intoxication, uncomplicated: Secondary | ICD-10-CM | POA: Insufficient documentation

## 2018-04-16 DIAGNOSIS — Z79899 Other long term (current) drug therapy: Secondary | ICD-10-CM | POA: Diagnosis not present

## 2018-04-16 DIAGNOSIS — F10939 Alcohol use, unspecified with withdrawal, unspecified: Secondary | ICD-10-CM | POA: Diagnosis present

## 2018-04-16 DIAGNOSIS — F10920 Alcohol use, unspecified with intoxication, uncomplicated: Secondary | ICD-10-CM

## 2018-04-16 LAB — COMPREHENSIVE METABOLIC PANEL
ALT: 138 U/L — AB (ref 0–44)
AST: 136 U/L — ABNORMAL HIGH (ref 15–41)
Albumin: 4.5 g/dL (ref 3.5–5.0)
Alkaline Phosphatase: 59 U/L (ref 38–126)
Anion gap: 11 (ref 5–15)
BUN: 7 mg/dL (ref 6–20)
CHLORIDE: 112 mmol/L — AB (ref 98–111)
CO2: 22 mmol/L (ref 22–32)
Calcium: 8.2 mg/dL — ABNORMAL LOW (ref 8.9–10.3)
Creatinine, Ser: 0.75 mg/dL (ref 0.61–1.24)
GFR calc Af Amer: >60 mL/min (ref >60)
GFR calc non Af Amer: >60 mL/min (ref >60)
Glucose, Bld: 81 mg/dL (ref 70–99)
Potassium: 3.4 mmol/L — ABNORMAL LOW (ref 3.5–5.1)
Sodium: 145 mmol/L (ref 135–145)
Total Bilirubin: 1.3 mg/dL — ABNORMAL HIGH (ref 0.3–1.2)
Total Protein: 8 g/dL (ref 6.5–8.1)

## 2018-04-16 LAB — RAPID URINE DRUG SCREEN, HOSP PERFORMED
AMPHETAMINES: NOT DETECTED
Barbiturates: NOT DETECTED
Benzodiazepines: NOT DETECTED
Cocaine: NOT DETECTED
Opiates: NOT DETECTED
TETRAHYDROCANNABINOL: NOT DETECTED

## 2018-04-16 LAB — CBC
HCT: 41.3 % (ref 39.0–52.0)
Hemoglobin: 12.9 g/dL — ABNORMAL LOW (ref 13.0–17.0)
MCH: 28.2 pg (ref 26.0–34.0)
MCHC: 31.2 g/dL (ref 30.0–36.0)
MCV: 90.2 fL (ref 80.0–100.0)
NRBC: 0 % (ref 0.0–0.2)
Platelets: 180 10*3/uL (ref 150–400)
RBC: 4.58 MIL/uL (ref 4.22–5.81)
RDW: 15 % (ref 11.5–15.5)
WBC: 3.1 10*3/uL — ABNORMAL LOW (ref 4.0–10.5)

## 2018-04-16 LAB — FOLATE: Folate: 15.6 ng/mL (ref >5.9)

## 2018-04-16 LAB — SALICYLATE LEVEL: Salicylate Lvl: <7 mg/dL (ref 2.8–30.0)

## 2018-04-16 LAB — ETHANOL: Alcohol, Ethyl (B): 341 mg/dL (ref <10)

## 2018-04-16 LAB — ACETAMINOPHEN LEVEL: Acetaminophen (Tylenol), Serum: <10 ug/mL — ABNORMAL LOW (ref 10–30)

## 2018-04-16 LAB — VITAMIN B12: Vitamin B-12: 736 pg/mL (ref 180–914)

## 2018-04-16 MED ORDER — LORAZEPAM 1 MG PO TABS
0.0000 mg | ORAL_TABLET | Freq: Four times a day (QID) | ORAL | Status: DC
  Administered 2018-04-17: 1 mg via ORAL
  Administered 2018-04-17: 2 mg via ORAL
  Filled 2018-04-16: qty 2
  Filled 2018-04-16: qty 1

## 2018-04-16 MED ORDER — ONDANSETRON HCL 4 MG PO TABS: 4.0000 mg | ORAL_TABLET | Freq: Three times a day (TID) | ORAL | Status: DC | PRN

## 2018-04-16 MED ORDER — STERILE WATER FOR INJECTION IJ SOLN
INTRAMUSCULAR | Status: AC
  Filled 2018-04-16: qty 10

## 2018-04-16 MED ORDER — THIAMINE HCL 100 MG/ML IJ SOLN: 100.0000 mg | Freq: Every day | INTRAMUSCULAR | Status: DC

## 2018-04-16 MED ORDER — FOLIC ACID 5 MG/ML IJ SOLN
1.0000 mg | Freq: Every day | INTRAMUSCULAR | Status: DC
  Administered 2018-04-16: 1 mg via INTRAVENOUS
  Filled 2018-04-16 (×2): qty 0.2

## 2018-04-16 MED ORDER — IBUPROFEN 200 MG PO TABS: 600.0000 mg | ORAL_TABLET | Freq: Three times a day (TID) | ORAL | Status: DC | PRN

## 2018-04-16 MED ORDER — ZIPRASIDONE MESYLATE 20 MG IM SOLR
20.0000 mg | INTRAMUSCULAR | Status: DC | PRN
  Administered 2018-04-16: 20 mg via INTRAMUSCULAR
  Filled 2018-04-16: qty 20

## 2018-04-16 MED ORDER — VITAMIN B-1 100 MG PO TABS
100.0000 mg | ORAL_TABLET | Freq: Every day | ORAL | Status: DC
  Administered 2018-04-17: 100 mg via ORAL
  Filled 2018-04-16: qty 1

## 2018-04-16 MED ORDER — LORAZEPAM 1 MG PO TABS: 0.0000 mg | ORAL_TABLET | Freq: Two times a day (BID) | ORAL | Status: DC

## 2018-04-16 MED ORDER — LORAZEPAM 2 MG/ML IJ SOLN: 0.0000 mg | Freq: Four times a day (QID) | INTRAMUSCULAR | Status: DC

## 2018-04-16 MED ORDER — SODIUM CHLORIDE 0.9 % IV BOLUS
1000.0000 mL | Freq: Once | INTRAVENOUS | Status: AC
  Administered 2018-04-16: 1000 mL via INTRAVENOUS

## 2018-04-16 MED ORDER — LORAZEPAM 2 MG/ML IJ SOLN: 0.0000 mg | Freq: Two times a day (BID) | INTRAMUSCULAR | Status: DC

## 2018-04-16 MED ORDER — THIAMINE HCL 100 MG/ML IJ SOLN
100.0000 mg | Freq: Once | INTRAMUSCULAR | Status: AC
  Administered 2018-04-16: 100 mg via INTRAVENOUS
  Filled 2018-04-16: qty 2

## 2018-04-16 NOTE — ED Notes (Addendum)
Date and time results received: 04/16/18 2333 (use smartphrase ".now" to insert current time)  Test: ETOH Critical Value: 341  Name of Provider Notified: Mina PA   Orders Received? Or Actions Taken?: waiting on orders.

## 2018-04-16 NOTE — ED Notes (Signed)
Clinician attempted TTS assessment, per Judeth Cornfield, patient was just given 20 geodon due to agitation and hallucinating. TTS assessment to be completed at later time.

## 2018-04-16 NOTE — ED Notes (Signed)
Per GF [name], pt has drunk 2 bottles a day since having gastric bypass years ago.  She sts his family wants him to go to rehab. Pt has never been to rehab. Daughter : Eray Collinson [phone] [id number]   Geralynn Ochs  [phone] Girlfriend : Berneda Rose   [phone]

## 2018-04-16 NOTE — ED Provider Notes (Signed)
[facility]-EMERGENCY DEPT Provider Note   CSN: [id number] Arrival date & time: 04/16/18  2048    History   Chief Complaint Chief Complaint  Patient presents with  . IVC    HPI [name] is a 48 y.o. male with history of hypertension, alcoholic ketoacidosis, abnormal transaminases, leukopenia, hypokalemia presents for evaluation.  Per EMS, patient was sent from behavioral health [facility].  His girlfriend was attempting to bring him to the behavioral health Hospital to seek resources for alcohol abuse.  He was found to be too intoxicated for them to evaluate him so he was sent to the ED for further evaluation and management.  The patient is obviously intoxicated in the ED and tells me that he likes to drink 2 pints of liquor daily.  He does not know where he is or what day of the week it is.  He does know that he is originally from [place].  He tells me that he does have suicidal thoughts but is unsure of any plan.  Denies homicidal ideation.  He does endorse auditory and visual hallucinations but will not elaborate when asked to do so.  He denies any chest pain, shortness of breath, nausea, or vomiting.  No fevers or chills.  He is tearful on my assessment and attempting to leave but cannot ambulate independently.  Has a history of gastric bypass.  Level 5 caveat invoked due to alcohol intoxication    The history is provided by the patient and the EMS personnel. The history is limited by the condition of the patient.    Past Medical History:  Diagnosis Date  . Hypertension     Patient Active Problem List   Diagnosis Date Noted  . Alcoholic ketoacidosis 02/17/2018  . Alcohol withdrawal (HCC) 01/14/2018  . Abnormal transaminases 01/14/2018  . Essential hypertension 01/14/2018  . Hypokalemia 01/14/2018  . Leukopenia 01/14/2018    Past Surgical History:  Procedure Laterality Date  . CHOLECYSTECTOMY    . GASTRIC BYPASS          Home Medications      Prior to Admission medications   Medication Sig Start Date End Date Taking? Authorizing Provider  fluticasone (FLONASE) 50 MCG/ACT nasal spray Place 2 sprays into both nostrils daily. 03/28/18 04/27/18 Yes [provider]  lidocaine-hydrocortisone (ANAMANTEL HC) 3-0.5 % CREA Place 1 Applicatorful rectally 2 (two) times daily. 04/07/18  Yes [provider]  amLODipine (NORVASC) 10 MG tablet Take 10 mg by mouth daily.    [provider]  docusate sodium (COLACE) 100 MG capsule Take 1 capsule (100 mg total) by mouth 2 (two) times daily. Patient not taking: Reported on 02/17/2018 01/16/18   Meredeth Ide, MD  folic acid (FOLVITE) 1 MG tablet Take 1 tablet (1 mg total) by mouth daily. 01/17/18   Meredeth Ide, MD  hydrocortisone (ANUSOL-HC) 25 MG suppository Place 1 suppository (25 mg total) rectally 2 (two) times daily. Patient not taking: Reported on 02/17/2018 01/16/18   Meredeth Ide, MD  Menthol-Methyl Salicylate (MUSCLE RUB) 10-15 % CREA Apply 1 application topically as needed for muscle pain.    [provider]  niacin 100 MG tablet Take 100 mg by mouth daily.    [provider]  omeprazole (PRILOSEC OTC) 20 MG tablet Take 1 tablet (20 mg total) by mouth daily. 01/16/18 03/17/18  Meredeth Ide, MD  thiamine 100 MG tablet Take 1 tablet (100 mg total) by mouth daily. 01/17/18  Meredeth Ide, MD  vitamin B-12 (CYANOCOBALAMIN) 1000 MCG tablet Take 1,000 mcg by mouth daily.    [provider]    Family History Family History  Problem Relation Age of Onset  . Leukemia Mother     Social History Social History   Tobacco Use  . Smoking status: Current Some Day Smoker    Types: Cigars  . Smokeless tobacco: Never Used  . Tobacco comment: black and mild cigars  Substance Use Topics  . Alcohol use: Yes    Comment: 3 pints liquor daily  . Drug use: Not Currently     Allergies   Oxycodone and Pollen extract   Review of Systems Review  of Systems  Unable to perform ROS: Mental status change     Physical Exam Updated Vital Signs BP 125/79   Pulse 92   Resp 15   SpO2 97%   Physical Exam Vitals signs and nursing note reviewed.  Constitutional:      General: He is not in acute distress.    Appearance: He is well-developed.     Comments: Smells of alcohol.  Clothing is clean.  He is randomly flailing his arms around as though to push away people that are not there.  HENT:     Head: Normocephalic and atraumatic.  Eyes:     General:        Right eye: No discharge.        Left eye: No discharge.     Conjunctiva/sclera: Conjunctivae normal.  Neck:     Musculoskeletal: Normal range of motion and neck supple.     Vascular: No JVD.     Trachea: No tracheal deviation.  Cardiovascular:     Rate and Rhythm: Tachycardia present.     Pulses: Normal pulses.     Heart sounds: Normal heart sounds.  Pulmonary:     Effort: Pulmonary effort is normal.     Breath sounds: Normal breath sounds.  Abdominal:     General: Abdomen is flat. There is no distension.     Tenderness: There is no abdominal tenderness. There is no guarding or rebound.  Musculoskeletal: Normal range of motion.     Comments: Moving extremity spontaneously with intact strength  Skin:    General: Skin is warm and dry.     Findings: No erythema.  Neurological:     Mental Status: He is alert.     Comments: Dysarthric speech, oriented to person only.  Follows commands without difficulty but appears somewhat agitated.  Psychiatric:        Attention and Perception: He is inattentive.        Mood and Affect: Mood is depressed. Affect is tearful.        Speech: Speech is delayed.        Behavior: Behavior is uncooperative, agitated and withdrawn.        Thought Content: Thought content includes suicidal ideation. Thought content does not include homicidal ideation. Thought content does not include homicidal or suicidal plan.     Comments: Possibly responding  to internal stimuli      ED Treatments / Results  Labs (all labs ordered are listed, but only abnormal results are displayed) Labs Reviewed  COMPREHENSIVE METABOLIC PANEL - Abnormal; Notable for the following components:      Result Value   Potassium 3.4 (*)    Chloride 112 (*)    Calcium 8.2 (*)    AST 136 (*)    ALT 138 (*)  Total Bilirubin 1.3 (*)    All other components within normal limits  ETHANOL - Abnormal; Notable for the following components:   Alcohol, Ethyl (B) 341 (*)    All other components within normal limits  ACETAMINOPHEN LEVEL - Abnormal; Notable for the following components:   Acetaminophen (Tylenol), Serum <10 (*)    All other components within normal limits  CBC - Abnormal; Notable for the following components:   WBC 3.1 (*)    Hemoglobin 12.9 (*)    All other components within normal limits  SALICYLATE LEVEL  RAPID URINE DRUG SCREEN, HOSP PERFORMED  FOLATE  VITAMIN B12  VITAMIN B1    EKG None  Radiology No results found.  Procedures Procedures (including critical care time)  Medications Ordered in ED Medications  ziprasidone (GEODON) injection 20 mg (20 mg Intramuscular Given 04/16/18 2115)  sterile water (preservative free) injection (has no administration in time range)  folic acid injection 1 mg (1 mg Intravenous Given 04/16/18 2227)  LORazepam (ATIVAN) injection 0-4 mg (0 mg Intravenous Not Given 04/17/18 0007)    Or  LORazepam (ATIVAN) tablet 0-4 mg ( Oral See Alternative 04/17/18 0007)  LORazepam (ATIVAN) injection 0-4 mg (has no administration in time range)    Or  LORazepam (ATIVAN) tablet 0-4 mg (has no administration in time range)  thiamine (VITAMIN B-1) tablet 100 mg (has no administration in time range)    Or  thiamine (B-1) injection 100 mg (has no administration in time range)  ibuprofen (ADVIL,MOTRIN) tablet 600 mg (has no administration in time range)  ondansetron (ZOFRAN) tablet 4 mg (has no administration in time  range)  thiamine (B-1) injection 100 mg (100 mg Intravenous Given 04/16/18 2227)  sodium chloride 0.9 % bolus 1,000 mL (0 mLs Intravenous Stopped 04/17/18 0007)     Initial Impression / Assessment and Plan / ED Course  I have reviewed the triage vital signs and the nursing notes.  Pertinent labs & imaging results that were available during my care of the patient were reviewed by me and considered in my medical decision making (see chart for details).  Patient presents brought in by EMS for evaluation.  Patient's girlfriend was attempting to take him to behavioral health for evaluation of his alcoholism and suicidal ideations.  He is obviously intoxicated in the ED today.  He is mildly tachycardic, vital signs otherwise stable but unable to obtain temperature as patient did not tolerate this.  He is somewhat agitated and is attempting to leave but is too intoxicated to do so, so IVC paperwork was taken out against him.  He did require Geodon for his agitation.  Lab work reviewed by me shows elevated transaminases, neutropenia, and anemia which are stable.  Ethanol level is elevated.  Remainder of screening labs are reassuring.  He is medically cleared for TTS evaluation at this time.  CIWA protocol in place.  Final disposition pending psychiatric evaluation.  Final Clinical Impressions(s) / ED Diagnoses   Final diagnoses:  Alcoholic intoxication without complication [facility])  Suicidal ideation    ED Discharge Orders    None       Bennye Alm 04/17/18 Estrella Myrtle    Shaune Pollack, MD 04/17/18 1350

## 2018-04-16 NOTE — ED Notes (Signed)
Pt dressed in burgandy scrubs without incident. Pt has invol twitches, and sts " Im leaving , you cant keep me here".  Pt is currently laying on bed cooperative with redirection.

## 2018-04-17 MED ORDER — AMLODIPINE BESYLATE 5 MG PO TABS
10.0000 mg | ORAL_TABLET | Freq: Every day | ORAL | Status: DC
  Administered 2018-04-17: 10 mg via ORAL
  Filled 2018-04-17: qty 2

## 2018-04-17 MED ORDER — LOPERAMIDE HCL 2 MG PO CAPS: 2.0000 mg | ORAL_CAPSULE | Freq: Once | ORAL | Status: DC

## 2018-04-17 MED ORDER — FOLIC ACID 1 MG PO TABS
1.0000 mg | ORAL_TABLET | Freq: Every day | ORAL | Status: DC
  Administered 2018-04-17: 1 mg via ORAL
  Filled 2018-04-17: qty 1

## 2018-04-17 NOTE — BHH Suicide Risk Assessment (Signed)
Suicide Risk Assessment  Discharge Assessment   [facility] Discharge Suicide Risk Assessment   Principal Problem: Alcohol withdrawal (HCC) Discharge Diagnoses: Principal Problem:   Alcohol withdrawal (HCC)   Total Time spent with patient: 30 minutes  Musculoskeletal: Strength & Muscle Tone: within normal limits Gait & Station: normal Patient leans: N/A  Psychiatric Specialty Exam:   Blood pressure (!) 163/97, pulse 93, temperature 99 F (37.2 C), temperature source Oral, resp. rate 17, SpO2 97 %.There is no height or weight on file to calculate BMI.  General Appearance: Casual  Eye Contact::  Good  Speech:  Clear and Coherent and Normal Rate409  Volume:  Normal  Mood:  Depressed  Affect:  Congruent and Depressed  Thought Process:  Coherent, Goal Directed, Linear and Descriptions of Associations: Intact  Orientation:  Full (Time, Place, and Person)  Thought Content:  Logical  Suicidal Thoughts:  No  Homicidal Thoughts:  No  Memory:  Immediate;   Good Recent;   Good Remote;   Fair  Judgement:  Fair  Insight:  Fair  Psychomotor Activity:  Normal  Concentration:  Good  Recall:  Good  Fund of Knowledge:Good  Language: Good  Akathisia:  No  Handed:  Right  AIMS (if indicated):     Assets:  Administrator, arts Vocational/Educational  Sleep:     Cognition: WNL  ADL's:  Intact   Mental Status Per Nursing Assessment::   On Admission:   Pt was intoxicated upon admission, BAL 341, UDS negative. Pt stated he wants to go to a facility for detox and rehab for his alcoholism.  Pt denies suicidal/homicidal ideation, denies auditory/visual hallucinations and does not appear to be responding to internal stimuli. Pt stated he feels safe to discharge and has no access to weapons. He lives with his girlfriend and has family support. Pt will be seen by Peer Support for substance abuse resources in the community. Pt is  psychiatrically clear.   Demographic Factors:  Male and Low socioeconomic status  Loss Factors: Financial problems/change in socioeconomic status  Historical Factors: Family history of mental illness or substance abuse  Risk Reduction Factors:   Sense of responsibility to family  Continued Clinical Symptoms:  Depression:   Anhedonia Alcohol/Substance Abuse/Dependencies  Cognitive Features That Contribute To Risk:  Closed-mindedness    Suicide Risk:  Minimal: No identifiable suicidal ideation.  Patients presenting with no risk factors but with morbid ruminations; may be classified as minimal risk based on the severity of the depressive symptoms    Plan Of Care/Follow-up recommendations:  Activity:  as tolerated Diet:  heart healthy  Laveda Abbe, NP 04/17/2018, 11:12 AM

## 2018-04-17 NOTE — BH Assessment (Signed)
Assessment Note  [name] is an 48 y.o. male presenting voluntarily to [facility] ED after being found intoxicated at [facility]. IVC was petitioned by EDP. Per EDP: "Patient states he is having suicidal thoughts. He was found outside [facility] but was too intoxicated to be evaluated there. States he drank 2 bottles of vodka today. He states he is seeing and hearing things that are not there."  Patient had difficulty waking up when this clinician had went to assess. Patient verified his name and date of birth but did not know his location or the events of the previous day that brought him to the ED. Patient states he needs assistance with detox. He reviewed with clinician that he relocated from [place] to [place] to help his 76 year old daughter but "everything went haywire" due to his alcohol use. He states he is now living with his girlfriend, [name]. Patient states he has struggled with alcohol addiction for the past 10 years following gastric bypass surgery. He states that he continued to drink the same amount he did prior to his 150 lb weight loss and developed an addiction, drinking 2 pints of vodka daily. He denies any other substance use. He denies SI/HI. Patient endorses AVH of "things crawling and monsters." Patient indicated that he has experienced these the past 2 years when experiencing alcohol withdrawal. He reports in the past he was seen by a therapist and psychiatrist for his anxiety, denies any other mental health concerns. He states he has a family history of alcoholism and drug use on both his father and mother's side. Patient denies any criminal charges of history of violence. Patient gave verbal consent for TTS to speak with his girlfriend, [name] [phone]. TTS attempted to call but she did not answer and voice mailbox was full.  Patient is alert and oriented x 2. He is dressed in scrubs and sleeping but was able to awaken for assessment. His speech is soft and logical. His mood is  depressed but pleasant, affect is congruent. His insight is fair but insight and impulse control are poor. Patient did not appear to be responding to internal stimuli or experiencing delusional thought content.  Diagnosis: F10.20 Alcohol use disorder, severe  Past Medical History:  Past Medical History:  Diagnosis Date  . Hypertension     Past Surgical History:  Procedure Laterality Date  . CHOLECYSTECTOMY    . GASTRIC BYPASS      Family History:  Family History  Problem Relation Age of Onset  . Leukemia Mother     Social History:  reports that he has been smoking cigars. He has never used smokeless tobacco. He reports current alcohol use. He reports previous drug use.  Additional Social History:  Alcohol / Drug Use Pain Medications: see MAR Prescriptions: see MAR Over the Counter: see MAR History of alcohol / drug use?: Yes Substance #1 Name of Substance 1: Alcohol 1 - Age of First Use: UTA 1 - Amount (size/oz): 2 pints vodka 1 - Frequency: daily 1 - Duration: 10 years 1 - Last Use / Amount: 04/16/2018- 2 pints  CIWA: CIWA-Ar BP: (!) 163/97 Pulse Rate: 93 Nausea and Vomiting: no nausea and no vomiting Tactile Disturbances: none Tremor: five Auditory Disturbances: very mild harshness or ability to frighten Paroxysmal Sweats: barely perceptible sweating, palms moist Visual Disturbances: very mild sensitivity Anxiety: three Headache, Fullness in Head: none present Agitation: somewhat more than normal activity Orientation and Clouding of Sensorium: oriented and can do serial additions CIWA-Ar  Total: 12 COWS:    Allergies:  Allergies  Allergen Reactions  . Oxycodone Anaphylaxis  . Pollen Extract Shortness Of Breath, Itching and Other (See Comments)    Runny nose, itchy eyes, sniffles    Home Medications: (Not in a hospital admission)   OB/GYN Status:  No LMP for male patient.  General Assessment Data Assessment unable to be completed: Yes Reason for not  completing assessment: (agitated given geodon, asleep) Location of Assessment: [facility] ED TTS Assessment: In system Is this a Tele or Face-to-Face Assessment?: Face-to-Face Is this an Initial Assessment or a Re-assessment for this encounter?: Initial Assessment Patient Accompanied by:: N/A Language Other than English: No Living Arrangements: (girlfriend's home) What gender do you identify as?: Male Marital status: Single Maiden name: Eastridge Pregnancy Status: No Living Arrangements: Spouse/significant other Can pt return to current living arrangement?: Yes Admission Status: Involuntary Petitioner: ED Attending Is patient capable of signing voluntary admission?: No Referral Source: Self/Family/Friend Insurance type: None     Crisis Care Plan Living Arrangements: Spouse/significant other Legal Guardian: (self) Name of Psychiatrist: none Name of Therapist: none  Education Status Is patient currently in school?: No Is the patient employed, unemployed or receiving disability?: Employed  Risk to self with the past 6 months Suicidal Ideation: No-Not Currently/Within Last 6 Months Has patient been a risk to self within the past 6 months prior to admission? : No Suicidal Intent: No Has patient had any suicidal intent within the past 6 months prior to admission? : No Is patient at risk for suicide?: No Suicidal Plan?: No Has patient had any suicidal plan within the past 6 months prior to admission? : No Access to Means: No What has been your use of drugs/alcohol within the last 12 months?: daily alcohol use Previous Attempts/Gestures: No How many times?: 0 Other Self Harm Risks: none Triggers for Past Attempts: None known Intentional Self Injurious Behavior: None Family Suicide History: No Recent stressful life event(s): Recent negative physical changes, Conflict (Comment)(conflict with daughter) Persecutory voices/beliefs?: No Depression: Yes Depression Symptoms: Despondent,  Tearfulness, Isolating, Fatigue, Guilt, Loss of interest in usual pleasures, Insomnia, Feeling worthless/self pity, Feeling angry/irritable Substance abuse history and/or treatment for substance abuse?: No Suicide prevention information given to non-admitted patients: Not applicable  Risk to Others within the past 6 months Homicidal Ideation: No Does patient have any lifetime risk of violence toward others beyond the six months prior to admission? : No Thoughts of Harm to Others: No Current Homicidal Intent: No Current Homicidal Plan: No Access to Homicidal Means: No Identified Victim: none History of harm to others?: No Assessment of Violence: None Noted Violent Behavior Description: none noted Does patient have access to weapons?: No Criminal Charges Pending?: No Does patient have a court date: No Is patient on probation?: No  Psychosis Hallucinations: Auditory, Visual Delusions: None noted  Mental Status Report Appearance/Hygiene: In scrubs Eye Contact: Fair Motor Activity: Freedom of movement Speech: Soft, Logical/coherent Level of Consciousness: Quiet/awake Mood: Depressed, Pleasant Affect: Depressed Anxiety Level: Moderate Thought Processes: Coherent, Relevant Judgement: Impaired Orientation: Person Obsessive Compulsive Thoughts/Behaviors: None  Cognitive Functioning Concentration: Fair Memory: Recent Impaired, Remote Intact Is patient IDD: No Insight: Fair Impulse Control: Poor Appetite: Good Have you had any weight changes? : No Change Sleep: Decreased Total Hours of Sleep: (UTA) Vegetative Symptoms: None  ADLScreening [facility] Services) Patient's cognitive ability adequate to safely complete daily activities?: Yes Patient able to express need for assistance with ADLs?: No Independently performs ADLs?: Yes (appropriate for developmental  age)  Prior Inpatient Therapy Prior Inpatient Therapy: No  Prior Outpatient Therapy Prior Outpatient  Therapy: Yes Prior Therapy Dates: (UTA- "a long time ago") Prior Therapy Facilty/Provider(s): (UTA- "place in [place]") Reason for Treatment: anxiety Does patient have an ACCT team?: No Does patient have Intensive In-House Services?  : No Does patient have [facility] services? : No Does patient have P4CC services?: No  ADL Screening (condition at time of admission) Patient's cognitive ability adequate to safely complete daily activities?: Yes Is the patient deaf or have difficulty hearing?: No Does the patient have difficulty seeing, even when wearing glasses/contacts?: No Does the patient have difficulty concentrating, remembering, or making decisions?: Yes Patient able to express need for assistance with ADLs?: No Does the patient have difficulty dressing or bathing?: No Independently performs ADLs?: Yes (appropriate for developmental age) Does the patient have difficulty walking or climbing stairs?: No Weakness of Legs: None Weakness of Arms/Hands: None  Home Assistive Devices/Equipment Home Assistive Devices/Equipment: None  Therapy Consults (therapy consults require a physician order) PT Evaluation Needed: No OT Evalulation Needed: No SLP Evaluation Needed: No Abuse/Neglect Assessment (Assessment to be complete while patient is alone) Abuse/Neglect Assessment Can Be Completed: Yes Physical Abuse: Denies Verbal Abuse: Denies Sexual Abuse: Denies Exploitation of patient/patient's resources: Denies Self-Neglect: Denies Values / Beliefs Cultural Requests During Hospitalization: None Spiritual Requests During Hospitalization: None Consults Spiritual Care Consult Needed: No Social Work Consult Needed: No Merchant navy officer (For Healthcare) Does Patient Have a Medical Advance Directive?: No Would patient like information on creating a medical advance directive?: No - Patient declined          Disposition: Per Dr. Sharma Covert patient is psych cleared with a Peer Support  consult. Disposition Initial Assessment Completed for this Encounter: Yes  On Site Evaluation by:   Reviewed with Physician:    Celedonio Miyamoto 04/17/2018 8:25 AM

## 2018-04-17 NOTE — Discharge Instructions (Signed)
To help you maintain a sober lifestyle, a substance abuse treatment program may be beneficial to you.  Contact one of the following facilities at your earliest opportunity to ask about enrolling:  RESIDENTIAL PROGRAMS:       [facility]      [address]      [place], [place] [address]      [phone]  OUTPATIENT PROGRAMS (CHEMICAL DEPENDENCY INTENSIVE OUTPATIENT PROGRAMS):       [facility]      [address] [address], [place] [address]      Contact person: Charmian Muff, LCAS      [phone]       [facility]      [address] [place], [place] [address]      [phone]

## 2018-04-17 NOTE — ED Notes (Signed)
Patient transferred to acute care unit at this time.

## 2018-04-17 NOTE — ED Notes (Addendum)
Report to include Situation, Background, Assessment, and Recommendations received from [place], [place]. Patient is sedated and lethargic, unable to answer questions or give much information (patient was given IM medications prior to arrival for agitation). Pt. Oriented to his room. Patient made aware of Q15 minute rounds and security cameras for their safety. Patient instructed to come to me with needs or concerns.

## 2018-04-17 NOTE — BHH Counselor (Signed)
TTS obtained collateral from patient's girlfriend, [name]. She stated she does not have safety concerns with patient being discharged, however expressed that she and his family are trying to get him into treatment for his alcohol use. This clinician explained he is being linked with peer support to assist in that process, however, also reported to her that he has been psychiatrically cleared for discharge. [name] confirmed he is staying with her currently and that he is welcome back in her home.

## 2018-04-17 NOTE — BH Assessment (Signed)
[facility] Assessment Progress Note  Per Jacqueline Norman, DO, this pt does not require psychiatric hospitalization at this time.  Pt presents under IVC initiated by  EDP Cameron Isaacs, MD, which Dr Norman has rescinded.  Pt is to be discharged from [facility] with referral information for area substance abuse treatment providers.  This has been included in pt's discharge instructions.  Pt would also benefit from seeing Peer Support Specialists; they will be asked to speak to pt.  Pt's nurse, Ashley, has been notified.  Billie Trager, MA Triage Specialist [phone]     

## 2018-04-17 NOTE — Patient Outreach (Signed)
CPSS met with the patient to provide substance use recovery support and help with substance use treatment resources. Patient is interested in attending [place] meetings and has attended [place] meetings in the past. CPSS provided an [place] meeting list for both [facility], information for [employer] in [place], residential/outpatient substance use treatment center list for [employer], and [employer] contact information. CPSS strongly encouraged the patient to follow up with CPSS if needed for further help with substance use treatment resources after discharge from the [facility]

## 2018-04-19 LAB — VITAMIN B1: Vitamin B1 (Thiamine): 164.5 nmol/L (ref 66.5–200.0)

## 2018-06-21 ENCOUNTER — Encounter (HOSPITAL_COMMUNITY): Payer: Self-pay

## 2018-06-21 ENCOUNTER — Emergency Department (HOSPITAL_COMMUNITY)
Admission: EM | Admit: 2018-06-21 | Discharge: 2018-06-22 | Disposition: A | Discharge status: Other Acute Inpatient Hospital | Payer: BLUE CROSS/BLUE SHIELD | Attending: Emergency Medicine | Admitting: Emergency Medicine

## 2018-06-21 DIAGNOSIS — F1099 Alcohol use, unspecified with unspecified alcohol-induced disorder: Secondary | ICD-10-CM | POA: Diagnosis not present

## 2018-06-21 DIAGNOSIS — F17200 Nicotine dependence, unspecified, uncomplicated: Secondary | ICD-10-CM | POA: Insufficient documentation

## 2018-06-21 DIAGNOSIS — I10 Essential (primary) hypertension: Secondary | ICD-10-CM | POA: Diagnosis not present

## 2018-06-21 DIAGNOSIS — F10239 Alcohol dependence with withdrawal, unspecified: Secondary | ICD-10-CM | POA: Diagnosis present

## 2018-06-21 DIAGNOSIS — F332 Major depressive disorder, recurrent severe without psychotic features: Secondary | ICD-10-CM | POA: Diagnosis not present

## 2018-06-21 DIAGNOSIS — Z79899 Other long term (current) drug therapy: Secondary | ICD-10-CM | POA: Insufficient documentation

## 2018-06-21 DIAGNOSIS — F1094 Alcohol use, unspecified with alcohol-induced mood disorder: Secondary | ICD-10-CM

## 2018-06-21 DIAGNOSIS — R45851 Suicidal ideations: Secondary | ICD-10-CM

## 2018-06-21 DIAGNOSIS — R451 Restlessness and agitation: Secondary | ICD-10-CM | POA: Diagnosis present

## 2018-06-21 LAB — COMPREHENSIVE METABOLIC PANEL
ALT: 63 U/L — ABNORMAL HIGH (ref 0–44)
AST: 56 U/L — ABNORMAL HIGH (ref 15–41)
Albumin: 4.6 g/dL (ref 3.5–5.0)
Alkaline Phosphatase: 60 U/L (ref 38–126)
Anion gap: 16 — ABNORMAL HIGH (ref 5–15)
BUN: 14 mg/dL (ref 6–20)
CO2: 20 mmol/L — ABNORMAL LOW (ref 22–32)
Calcium: 8 mg/dL — ABNORMAL LOW (ref 8.9–10.3)
Chloride: 109 mmol/L (ref 98–111)
Creatinine, Ser: 0.91 mg/dL (ref 0.61–1.24)
GFR calc Af Amer: >60 mL/min (ref >60)
GFR calc non Af Amer: >60 mL/min (ref >60)
Glucose, Bld: 74 mg/dL (ref 70–99)
Potassium: 3.6 mmol/L (ref 3.5–5.1)
Sodium: 145 mmol/L (ref 135–145)
Total Bilirubin: 2.3 mg/dL — ABNORMAL HIGH (ref 0.3–1.2)
Total Protein: 7.7 g/dL (ref 6.5–8.1)

## 2018-06-21 LAB — RAPID URINE DRUG SCREEN, HOSP PERFORMED
Amphetamines: NOT DETECTED
Barbiturates: NOT DETECTED
Benzodiazepines: NOT DETECTED
Cocaine: NOT DETECTED
Opiates: NOT DETECTED
Tetrahydrocannabinol: NOT DETECTED

## 2018-06-21 LAB — LIPASE, BLOOD: Lipase: 27 U/L (ref 11–51)

## 2018-06-21 LAB — CBC WITH DIFFERENTIAL/PLATELET
Abs Immature Granulocytes: 0.01 10*3/uL (ref 0.00–0.07)
Basophils Absolute: 0 10*3/uL (ref 0.0–0.1)
Basophils Relative: 1 %
Eosinophils Absolute: 0 10*3/uL (ref 0.0–0.5)
Eosinophils Relative: 0 %
HCT: 40.8 % (ref 39.0–52.0)
Hemoglobin: 13.3 g/dL (ref 13.0–17.0)
Immature Granulocytes: 0 %
Lymphocytes Relative: 18 %
Lymphs Abs: 0.6 10*3/uL — ABNORMAL LOW (ref 0.7–4.0)
MCH: 28.4 pg (ref 26.0–34.0)
MCHC: 32.6 g/dL (ref 30.0–36.0)
MCV: 87.2 fL (ref 80.0–100.0)
Monocytes Absolute: 0.2 10*3/uL (ref 0.1–1.0)
Monocytes Relative: 6 %
Neutro Abs: 2.6 10*3/uL (ref 1.7–7.7)
Neutrophils Relative %: 75 %
Platelets: 239 10*3/uL (ref 150–400)
RBC: 4.68 MIL/uL (ref 4.22–5.81)
RDW: 15.3 % (ref 11.5–15.5)
WBC: 3.5 10*3/uL — ABNORMAL LOW (ref 4.0–10.5)
nRBC: 0 % (ref 0.0–0.2)

## 2018-06-21 LAB — ETHANOL: Alcohol, Ethyl (B): 263 mg/dL — ABNORMAL HIGH (ref <10)

## 2018-06-21 MED ORDER — HALOPERIDOL 5 MG PO TABS: 5.0000 mg | ORAL_TABLET | Freq: Four times a day (QID) | ORAL | Status: DC | PRN

## 2018-06-21 MED ORDER — DIPHENHYDRAMINE HCL 50 MG/ML IJ SOLN
50.0000 mg | Freq: Once | INTRAMUSCULAR | Status: DC
  Filled 2018-06-21: qty 1

## 2018-06-21 MED ORDER — ZIPRASIDONE MESYLATE 20 MG IM SOLR
INTRAMUSCULAR | Status: AC
  Administered 2018-06-21: 20 mg via INTRAMUSCULAR
  Filled 2018-06-21: qty 20

## 2018-06-21 MED ORDER — LORAZEPAM 2 MG/ML IJ SOLN
2.0000 mg | Freq: Once | INTRAMUSCULAR | Status: DC
  Filled 2018-06-21: qty 1

## 2018-06-21 MED ORDER — ALUM & MAG HYDROXIDE-SIMETH 200-200-20 MG/5ML PO SUSP: 30.0000 mL | Freq: Four times a day (QID) | ORAL | Status: DC | PRN

## 2018-06-21 MED ORDER — ZIPRASIDONE MESYLATE 20 MG IM SOLR
20.0000 mg | Freq: Once | INTRAMUSCULAR | Status: AC
  Administered 2018-06-21: 20 mg via INTRAMUSCULAR

## 2018-06-21 MED ORDER — BENZTROPINE MESYLATE 1 MG PO TABS: 1.0000 mg | ORAL_TABLET | Freq: Four times a day (QID) | ORAL | Status: DC | PRN

## 2018-06-21 MED ORDER — LORAZEPAM 2 MG/ML IJ SOLN
2.0000 mg | Freq: Once | INTRAMUSCULAR | Status: AC
  Administered 2018-06-21: 2 mg via INTRAMUSCULAR
  Filled 2018-06-21: qty 1

## 2018-06-21 MED ORDER — IBUPROFEN 200 MG PO TABS
600.0000 mg | ORAL_TABLET | Freq: Three times a day (TID) | ORAL | Status: DC | PRN
  Administered 2018-06-21: 600 mg via ORAL
  Filled 2018-06-21: qty 3

## 2018-06-21 MED ORDER — ONDANSETRON HCL 4 MG PO TABS: 4.0000 mg | ORAL_TABLET | Freq: Three times a day (TID) | ORAL | Status: DC | PRN

## 2018-06-21 MED ORDER — STERILE WATER FOR INJECTION IJ SOLN
INTRAMUSCULAR | Status: AC
  Administered 2018-06-21: 1.2 mL
  Filled 2018-06-21: qty 10

## 2018-06-21 NOTE — ED Notes (Addendum)
Pt being moved to TCU-29 by security and RN. All belongings taken with and given to next RN and locked up in appropriate locker.

## 2018-06-21 NOTE — ED Notes (Addendum)
Pt changed into burgundy scrubs, yellow socks and all pt belongings labeled and put into bag and appropriate cabinet.  Pt has his glasses with him and TCU, Charity fundraiser and security aware

## 2018-06-21 NOTE — ED Notes (Signed)
Bed: VQ00 Expected date:  Expected time:  Means of arrival:  Comments: ETOH Combative at times

## 2018-06-21 NOTE — ED Notes (Signed)
Pt provided night time snacks and is cooperative during this time.

## 2018-06-21 NOTE — ED Notes (Signed)
He remains sound asleep. His skin is normal, warm and dry and he is breathing normally.

## 2018-06-21 NOTE — ED Provider Notes (Signed)
Geodon given. Pt will need TTS evaluation once awake enough to talk with the psychiatric team. IVC paperwork completed   Azalia Bilis, MD 06/21/18 1625

## 2018-06-21 NOTE — ED Notes (Signed)
Pt attempting to leave the facility. Security notified and assisted to pt back into the room. Pt saying he does not want to leave. Pt is IVCed per paperwork.

## 2018-06-21 NOTE — ED Notes (Signed)
He remains loud, profane and belligerent. He remains in bilat. Wrist and ankle gurney cuffs. Police maintain their vigil also. Dr. Patria Mane is examining him as I write this.

## 2018-06-21 NOTE — ED Notes (Addendum)
Report given to TCU rn then pt will be moved to TCU-32 once sitter is available. AC contacted and made aware

## 2018-06-21 NOTE — ED Notes (Signed)
We will await him to calm down to obtain labs.

## 2018-06-21 NOTE — ED Provider Notes (Signed)
[name]-EMERGENCY DEPT Provider Note   CSN: [id number] Arrival date & time: 06/21/18  1413    History   Chief Complaint Chief Complaint  Patient presents with  . Aggressive Behavior  . Suicidal   Level V caveat: psychiatric illness. ETOH intoxication HPI [name] is a 48 y.o. male.     HPI Pt became agitated today while in the car with his girlfriend. His girlfriend reports increasing ETOH use and suicidal thoughts. Got out of the car today and refused to get off the ground. Brought to ER by police. Yelling, not following directions, poor eye contact.    Past Medical History:  Diagnosis Date  . Hypertension     Patient Active Problem List   Diagnosis Date Noted  . Alcoholic ketoacidosis 02/17/2018  . Alcohol withdrawal (HCC) 01/14/2018  . Abnormal transaminases 01/14/2018  . Essential hypertension 01/14/2018  . Hypokalemia 01/14/2018  . Leukopenia 01/14/2018    Past Surgical History:  Procedure Laterality Date  . CHOLECYSTECTOMY    . GASTRIC BYPASS          Home Medications    Prior to Admission medications   Medication Sig Start Date End Date Taking? Authorizing Provider  amLODipine (NORVASC) 10 MG tablet Take 10 mg by mouth daily.    [provider]  docusate sodium (COLACE) 100 MG capsule Take 1 capsule (100 mg total) by mouth 2 (two) times daily. Patient not taking: Reported on 02/17/2018 01/16/18   Meredeth IdeLama, Gagan S, MD  folic acid (FOLVITE) 1 MG tablet Take 1 tablet (1 mg total) by mouth daily. 01/17/18   Meredeth IdeLama, Gagan S, MD  hydrocortisone (ANUSOL-HC) 25 MG suppository Place 1 suppository (25 mg total) rectally 2 (two) times daily. Patient not taking: Reported on 02/17/2018 01/16/18   Meredeth IdeLama, Gagan S, MD  omeprazole (PRILOSEC OTC) 20 MG tablet Take 1 tablet (20 mg total) by mouth daily. 01/16/18 04/17/18  Meredeth IdeLama, Gagan S, MD  thiamine 100 MG tablet Take 1 tablet (100 mg total) by mouth daily. 01/17/18   Meredeth IdeLama, Gagan S, MD    Family History Family History  Problem Relation Age of Onset  . Leukemia Mother     Social History Social History   Tobacco Use  . Smoking status: Current Some Day Smoker    Types: Cigars  . Smokeless tobacco: Never Used  . Tobacco comment: black and mild cigars  Substance Use Topics  . Alcohol use: Yes    Comment: 3 pints liquor daily  . Drug use: Not Currently     Allergies   Oxycodone and Pollen extract   Review of Systems Review of Systems  Unable to perform ROS: Psychiatric disorder     Physical Exam Updated Vital Signs There were no vitals taken for this visit.  Physical Exam Vitals signs and nursing note reviewed.  Constitutional:      Appearance: He is well-developed.  HENT:     Head: Normocephalic and atraumatic.  Neck:     Musculoskeletal: Normal range of motion.  Cardiovascular:     Rate and Rhythm: Normal rate and regular rhythm.     Heart sounds: Normal heart sounds.  Pulmonary:     Effort: Pulmonary effort is normal. No respiratory distress.     Breath sounds: Normal breath sounds.  Abdominal:     General: There is no distension.     Palpations: Abdomen is soft.     Tenderness: There is no abdominal tenderness.  Musculoskeletal: Normal range of motion.  Skin:    General: Skin is warm and dry.  Neurological:     Mental Status: He is alert and oriented to person, place, and time.  Psychiatric:        Mood and Affect: Affect is angry and inappropriate.        Speech: Speech is tangential.        Behavior: Behavior is aggressive.        Thought Content: Thought content includes suicidal ideation.        Cognition and Memory: Cognition is impaired.        Judgment: Judgment is impulsive.      ED Treatments / Results  Labs (all labs ordered are listed, but only abnormal results are displayed) Labs Reviewed  CBC WITH DIFFERENTIAL/PLATELET  COMPREHENSIVE METABOLIC PANEL  ETHANOL  RAPID URINE DRUG SCREEN, HOSP PERFORMED    EKG  None  Radiology No results found.  Procedures Procedures (including critical care time)  Medications Ordered in ED Medications  ziprasidone (GEODON) injection 20 mg (20 mg Intramuscular Given 06/21/18 1441)  sterile water (preservative free) injection (1.2 mLs  Given 06/21/18 1440)     Initial Impression / Assessment and Plan / ED Course  I have reviewed the triage vital signs and the nursing notes.  Pertinent labs & imaging results that were available during my care of the patient were reviewed by me and considered in my medical decision making (see chart for details).        2:41 PM Spoke with [name] who reports overuse of ETOH daily. Reports increasing depression and suicidal comments. [name] is concerned about the safety of the patient and believes he is a threat to himself at this time. Reports ETOH use today. Will need to sober in the ER and will hold for evaluation by TTS. IVC paper work will be filled out  Final Clinical Impressions(s) / ED Diagnoses   Final diagnoses:  None    ED Discharge Orders    None       Azalia Bilis, MD 06/21/18 1446

## 2018-06-21 NOTE — ED Notes (Signed)
Security at bedside

## 2018-06-21 NOTE — ED Triage Notes (Signed)
He arrives with [facility] and three [employer] officers. He is uncooperative, constantly threatening verbally and thrashing about. He told [employer] and paramedics that he is suicidal. He attempts to leave, and the three [employer] officers handcuff him and we apply hard ankle restraints (bilat.). Police tell us that his girlfriend called EMS when pt. Got out of their vehicle and laid down on the sidewalk and became very drowsy. Bowie, our P.A. meets him upon arrival.

## 2018-06-21 NOTE — ED Notes (Signed)
Pt resting watching T.V. became agitated  when told he could not leave the hospital. Md order 2 mg of IM Ativan,order completed the pt has become less agitated at this time. I will continue to monitor.

## 2018-06-22 ENCOUNTER — Encounter (HOSPITAL_COMMUNITY): Payer: Self-pay | Admitting: *Deleted

## 2018-06-22 ENCOUNTER — Other Ambulatory Visit: Payer: Self-pay

## 2018-06-22 ENCOUNTER — Ambulatory Visit (HOSPITAL_COMMUNITY): Payer: Self-pay

## 2018-06-22 ENCOUNTER — Inpatient Hospital Stay (HOSPITAL_COMMUNITY)
Admission: AD | Admit: 2018-06-22 | Discharge: 2018-06-24 | DRG: 885 | Disposition: A | Discharge status: Home / Self Care | Payer: BLUE CROSS/BLUE SHIELD | Attending: Psychiatry | Admitting: Psychiatry

## 2018-06-22 DIAGNOSIS — Z9884 Bariatric surgery status: Secondary | ICD-10-CM

## 2018-06-22 DIAGNOSIS — F1024 Alcohol dependence with alcohol-induced mood disorder: Secondary | ICD-10-CM | POA: Diagnosis not present

## 2018-06-22 DIAGNOSIS — G4700 Insomnia, unspecified: Secondary | ICD-10-CM | POA: Diagnosis present

## 2018-06-22 DIAGNOSIS — Z818 Family history of other mental and behavioral disorders: Secondary | ICD-10-CM | POA: Diagnosis not present

## 2018-06-22 DIAGNOSIS — F4310 Post-traumatic stress disorder, unspecified: Secondary | ICD-10-CM | POA: Diagnosis present

## 2018-06-22 DIAGNOSIS — M1990 Unspecified osteoarthritis, unspecified site: Secondary | ICD-10-CM | POA: Diagnosis present

## 2018-06-22 DIAGNOSIS — I10 Essential (primary) hypertension: Secondary | ICD-10-CM | POA: Diagnosis present

## 2018-06-22 DIAGNOSIS — F10239 Alcohol dependence with withdrawal, unspecified: Secondary | ICD-10-CM | POA: Diagnosis present

## 2018-06-22 DIAGNOSIS — F332 Major depressive disorder, recurrent severe without psychotic features: Principal | ICD-10-CM | POA: Diagnosis present

## 2018-06-22 DIAGNOSIS — Z885 Allergy status to narcotic agent status: Secondary | ICD-10-CM

## 2018-06-22 DIAGNOSIS — R45851 Suicidal ideations: Secondary | ICD-10-CM | POA: Diagnosis present

## 2018-06-22 DIAGNOSIS — Y908 Blood alcohol level of 240 mg/100 ml or more: Secondary | ICD-10-CM | POA: Diagnosis present

## 2018-06-22 DIAGNOSIS — Z806 Family history of leukemia: Secondary | ICD-10-CM | POA: Diagnosis not present

## 2018-06-22 MED ORDER — VITAMIN B-1 100 MG PO TABS: 100.0000 mg | ORAL_TABLET | Freq: Every day | ORAL | Status: DC

## 2018-06-22 MED ORDER — LORAZEPAM 1 MG PO TABS: 1.0000 mg | ORAL_TABLET | Freq: Three times a day (TID) | ORAL | Status: DC

## 2018-06-22 MED ORDER — PRAZOSIN HCL 1 MG PO CAPS: 1.0000 mg | ORAL_CAPSULE | Freq: Every day | ORAL | Status: DC

## 2018-06-22 MED ORDER — ONDANSETRON 4 MG PO TBDP: 4.0000 mg | ORAL_TABLET | Freq: Four times a day (QID) | ORAL | Status: DC | PRN

## 2018-06-22 MED ORDER — ACETAMINOPHEN 325 MG PO TABS
650.0000 mg | ORAL_TABLET | Freq: Four times a day (QID) | ORAL | Status: DC | PRN
  Administered 2018-06-23: 650 mg via ORAL
  Filled 2018-06-22: qty 2

## 2018-06-22 MED ORDER — THIAMINE HCL 100 MG/ML IJ SOLN
100.0000 mg | Freq: Once | INTRAMUSCULAR | Status: AC
  Administered 2018-06-22: 100 mg via INTRAMUSCULAR
  Filled 2018-06-22: qty 2

## 2018-06-22 MED ORDER — FLUOXETINE HCL 20 MG PO CAPS
40.0000 mg | ORAL_CAPSULE | Freq: Every day | ORAL | Status: DC
  Administered 2018-06-23 – 2018-06-24 (×2): 40 mg via ORAL
  Filled 2018-06-22 (×6): qty 2

## 2018-06-22 MED ORDER — VITAMIN B-1 100 MG PO TABS
100.0000 mg | ORAL_TABLET | Freq: Every day | ORAL | Status: DC
  Administered 2018-06-23 – 2018-06-24 (×2): 100 mg via ORAL
  Filled 2018-06-22 (×4): qty 1

## 2018-06-22 MED ORDER — ADULT MULTIVITAMIN W/MINERALS CH
1.0000 | ORAL_TABLET | Freq: Every day | ORAL | Status: DC
  Administered 2018-06-22: 1 via ORAL
  Filled 2018-06-22: qty 1

## 2018-06-22 MED ORDER — PRAZOSIN HCL 1 MG PO CAPS
1.0000 mg | ORAL_CAPSULE | Freq: Every day | ORAL | Status: DC
  Administered 2018-06-23: 1 mg via ORAL
  Filled 2018-06-22 (×3): qty 1

## 2018-06-22 MED ORDER — LORAZEPAM 1 MG PO TABS: 1.0000 mg | ORAL_TABLET | Freq: Two times a day (BID) | ORAL | Status: DC

## 2018-06-22 MED ORDER — ALUM & MAG HYDROXIDE-SIMETH 200-200-20 MG/5ML PO SUSP: 30.0000 mL | ORAL | Status: DC | PRN

## 2018-06-22 MED ORDER — LORAZEPAM 1 MG PO TABS
1.0000 mg | ORAL_TABLET | Freq: Four times a day (QID) | ORAL | Status: DC
  Administered 2018-06-22: 1 mg via ORAL
  Filled 2018-06-22: qty 1

## 2018-06-22 MED ORDER — HYDROXYZINE HCL 25 MG PO TABS: 25.0000 mg | ORAL_TABLET | Freq: Four times a day (QID) | ORAL | Status: DC | PRN

## 2018-06-22 MED ORDER — LOPERAMIDE HCL 2 MG PO CAPS: 2.0000 mg | ORAL_CAPSULE | ORAL | Status: DC | PRN

## 2018-06-22 MED ORDER — HYDROXYZINE HCL 25 MG PO TABS
25.0000 mg | ORAL_TABLET | Freq: Four times a day (QID) | ORAL | Status: DC | PRN
  Administered 2018-06-23: 25 mg via ORAL
  Filled 2018-06-22: qty 1

## 2018-06-22 MED ORDER — LORAZEPAM 1 MG PO TABS: 1.0000 mg | ORAL_TABLET | Freq: Four times a day (QID) | ORAL | Status: DC | PRN

## 2018-06-22 MED ORDER — LORAZEPAM 1 MG PO TABS: 1.0000 mg | ORAL_TABLET | Freq: Every day | ORAL | Status: DC

## 2018-06-22 MED ORDER — AMLODIPINE BESYLATE 5 MG PO TABS
10.0000 mg | ORAL_TABLET | Freq: Every day | ORAL | Status: DC
  Administered 2018-06-22: 10 mg via ORAL
  Filled 2018-06-22: qty 2

## 2018-06-22 MED ORDER — LORAZEPAM 1 MG PO TABS: 2.0000 mg | ORAL_TABLET | Freq: Once | ORAL | Status: AC

## 2018-06-22 MED ORDER — AMLODIPINE BESYLATE 10 MG PO TABS
10.0000 mg | ORAL_TABLET | Freq: Every day | ORAL | Status: DC
  Administered 2018-06-22 – 2018-06-24 (×3): 10 mg via ORAL
  Filled 2018-06-22 (×2): qty 1
  Filled 2018-06-22: qty 2
  Filled 2018-06-22 (×3): qty 1

## 2018-06-22 MED ORDER — TRAZODONE HCL 50 MG PO TABS: 50.0000 mg | ORAL_TABLET | Freq: Every evening | ORAL | Status: DC | PRN

## 2018-06-22 MED ORDER — PANTOPRAZOLE SODIUM 40 MG PO TBEC
40.0000 mg | DELAYED_RELEASE_TABLET | Freq: Every day | ORAL | Status: DC
  Administered 2018-06-22 – 2018-06-24 (×3): 40 mg via ORAL
  Filled 2018-06-22 (×6): qty 1

## 2018-06-22 MED ORDER — LORAZEPAM 1 MG PO TABS
1.0000 mg | ORAL_TABLET | Freq: Four times a day (QID) | ORAL | Status: AC
  Administered 2018-06-22 – 2018-06-23 (×4): 1 mg via ORAL
  Filled 2018-06-22: qty 2
  Filled 2018-06-22 (×3): qty 1

## 2018-06-22 MED ORDER — ADULT MULTIVITAMIN W/MINERALS CH
1.0000 | ORAL_TABLET | Freq: Every day | ORAL | Status: DC
  Administered 2018-06-22 – 2018-06-24 (×3): 1 via ORAL
  Filled 2018-06-22 (×5): qty 1

## 2018-06-22 MED ORDER — TRAZODONE HCL 50 MG PO TABS
25.0000 mg | ORAL_TABLET | Freq: Every evening | ORAL | Status: DC | PRN
  Administered 2018-06-22: 25 mg via ORAL
  Filled 2018-06-22 (×2): qty 1

## 2018-06-22 MED ORDER — LORAZEPAM 2 MG/ML IJ SOLN
2.0000 mg | Freq: Once | INTRAMUSCULAR | Status: AC
  Administered 2018-06-22: 2 mg via INTRAMUSCULAR
  Filled 2018-06-22: qty 1

## 2018-06-22 MED ORDER — LORAZEPAM 1 MG PO TABS
1.0000 mg | ORAL_TABLET | Freq: Once | ORAL | Status: AC
  Administered 2018-06-22: 1 mg via ORAL
  Filled 2018-06-22: qty 1

## 2018-06-22 MED ORDER — MAGNESIUM HYDROXIDE 400 MG/5ML PO SUSP: 30.0000 mL | Freq: Every day | ORAL | Status: DC | PRN

## 2018-06-22 NOTE — H&P (Signed)
Psychiatric Admission Assessment Adult  Patient Identification: [name] MRN:  [id number] Date of Evaluation:  06/22/2018 Chief Complaint: " This has happened before"  Principal Diagnosis: Alcohol Use Disorder, Alcohol Induced Mood Disorder versus MDD Diagnosis:  Active Problems:   Severe recurrent major depression without psychotic features (HCC)  History of Present Illness: [age] year male, presented to ED with EMS and [employer]. States he had an argument with his GF, got out of car in order to walk home, stopped to rest , and " police arrived, I did not call them"As per chart , patient was initially irritable , combative, uncooperative . Admission BAL 263. UDS negative. 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 Characterizes depression as "mild", however, and denies suicidal plans /intentions . Does endorse some neuro-vegetative symptoms as below, and describes intermittent passive suicidal thoughts, like " sometimes thinking it would be better not to be here".  Has been drinking up to 2 pints of liquor per day, up to day of admission.  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  Reports history of depression, denies history of mania or hypomania, reports prior history of brief auditory hallucinations, but not recently. Describes history of PTSD symptoms, as above .   Is the patient at risk to self? Yes.    Has the patient been a risk to self in the past 6 months? No.  Has the patient been a risk to self within the distant past? No.  Is the patient a risk to others? No.  Has the patient been a risk to others in the past 6 months? No.  Has the patient been a risk to others within the distant past? No.   Prior Inpatient Therapy:  denies  Prior Outpatient Therapy:  [facility]  Alcohol Screening:   Substance Abuse History in the last 12 months:  History of alcohol alcohol dependence, states he has been drinking up to pints of liquor per day. Denies drug abuse .  Consequences of Substance Abuse: Denies history of seizures , history of alcohol related blackouts , no DUIs  Previous Psychotropic Medications: Prozac 40 mgrs QDAY ( x several months- dose increased to 40 mgr one month ago). Minipress 1 mgr QHS x several months, Trazodone 50 mgrs QHS . Psychological Evaluations:  No  Past Medical History: history of gastric bypass ( Roux -Y) in 2010. States has lost 150 lbs since then . History of HTN. Reports history of chronic shoulder pain. Allergic to Oxycodone. Past Medical History:  Diagnosis Date  . Hypertension     Past Surgical History:  Procedure Laterality Date  . CHOLECYSTECTOMY    . GASTRIC BYPASS     Family History: parents deceased, mother died from leukemia, father died in a MVA,  has a twin brother, one sister Family History  Problem Relation Age of Onset  . Leukemia Mother    Family Psychiatric  History: mother had history of depression, had history of alcohol use disorder, father history of substance abuse, no suicides in family Tobacco Screening:  smokes cigars every 2-3 days  Social History: single, lives with GF, has two  biological children ( 27, 24), currently unemployed  Social History   Substance and Sexual Activity  Alcohol Use Yes   Comment: 3 pints liquor daily     Social History   Substance and Sexual Activity  Drug Use Not Currently    Additional Social History:  Allergies:   Allergies  Allergen Reactions  . Oxycodone Anaphylaxis  . Pollen Extract Shortness Of Breath, Itching and Other (See Comments)    Runny nose, itchy eyes, sniffles   Lab Results:  Results for orders placed or performed during the hospital encounter of 06/21/18 (from the past 48 hour(s))  CBC with Differential/Platelet     Status: Abnormal   Collection Time: 06/21/18  4:00 PM  Result Value Ref Range   WBC 3.5 (L) 4.0 - 10.5 K/uL   RBC 4.68 4.22 - 5.81 MIL/uL   Hemoglobin 13.3 13.0 - 17.0 g/dL   HCT 40.9 81.1 - 91.4 %   MCV 87.2 80.0 - 100.0 fL   MCH 28.4 26.0 - 34.0 pg   MCHC 32.6 30.0 - 36.0 g/dL   RDW 78.2 95.6 - 21.3 %   Platelets 239 150 - 400 K/uL   nRBC 0.0 0.0 - 0.2 %   Neutrophils Relative % 75 %   Neutro Abs 2.6 1.7 - 7.7 K/uL   Lymphocytes Relative 18 %   Lymphs Abs 0.6 (L) 0.7 - 4.0 K/uL   Monocytes Relative 6 %   Monocytes Absolute 0.2 0.1 - 1.0 K/uL   Eosinophils Relative 0 %   Eosinophils Absolute 0.0 0.0 - 0.5 K/uL   Basophils Relative 1 %   Basophils Absolute 0.0 0.0 - 0.1 K/uL   Immature Granulocytes 0 %   Abs Immature Granulocytes 0.01 0.00 - 0.07 K/uL    Comment: Performed at [facility], [address] [place], [place] [address]  Comprehensive metabolic panel     Status: Abnormal   Collection Time: 06/21/18  4:00 PM  Result Value Ref Range   Sodium 145 135 - 145 mmol/L   Potassium 3.6 3.5 - 5.1 mmol/L   Chloride 109 98 - 111 mmol/L   CO2 20 (L) 22 - 32 mmol/L   Glucose, Bld 74 70 - 99 mg/dL   BUN 14 6 - 20 mg/dL   Creatinine, Ser 8.46 0.61 - 1.24 mg/dL   Calcium 8.0 (L) 8.9 - 10.3 mg/dL   Total Protein 7.7 6.5 - 8.1 g/dL   Albumin 4.6 3.5 - 5.0 g/dL    AST 56 (H) 15 - 41 U/L   ALT 63 (H) 0 - 44 U/L   Alkaline Phosphatase 60 38 - 126 U/L   Total Bilirubin 2.3 (H) 0.3 - 1.2 mg/dL   GFR calc non Af Amer >60 >60 mL/min   GFR calc Af Amer >60 >60 mL/min   Anion gap 16 (H) 5 - 15    Comment: Performed at [facility], [address] [place], [place] [address]  Ethanol     Status: Abnormal   Collection Time: 06/21/18  4:00 PM  Result Value Ref Range   Alcohol, Ethyl (B) 263 (H) <  10 mg/dL    Comment: (NOTE) Lowest detectable limit for serum alcohol is 10 mg/dL. For medical purposes only. Performed at [facility], [address], [place], [place] [address]   Lipase, blood     Status: None   Collection Time: 06/21/18  4:00 PM  Result Value Ref Range   Lipase 27 11 - 51 U/L    Comment: Performed at [facility], [address], [place], [place] [address]  Rapid urine drug screen (hospital performed)     Status: None   Collection Time: 06/21/18  8:22 PM  Result Value Ref Range   Opiates NONE DETECTED NONE DETECTED   Cocaine NONE DETECTED NONE DETECTED   Benzodiazepines NONE DETECTED NONE DETECTED   Amphetamines NONE DETECTED NONE DETECTED   Tetrahydrocannabinol NONE DETECTED NONE DETECTED   Barbiturates NONE DETECTED NONE DETECTED    Comment: (NOTE) DRUG SCREEN FOR MEDICAL PURPOSES ONLY.  IF CONFIRMATION IS NEEDED FOR ANY PURPOSE, NOTIFY LAB WITHIN 5 DAYS. LOWEST DETECTABLE LIMITS FOR URINE DRUG SCREEN Drug Class                     Cutoff (ng/mL) Amphetamine and metabolites    1000 Barbiturate and metabolites    200 Benzodiazepine                 200 Tricyclics and metabolites     300 Opiates and metabolites        300 Cocaine and metabolites        300 THC                            50 Performed at [facility], [address] [place], [place] [address]     Blood Alcohol level:  Lab Results  Component Value Date   ETH 263 (H) 06/21/2018    ETH 341 (HH) 04/16/2018    Metabolic Disorder Labs:  No results found for: HGBA1C, MPG No results found for: PROLACTIN No results found for: CHOL, TRIG, HDL, CHOLHDL, VLDL, LDLCALC  Current Medications: Current Facility-Administered Medications  Medication Dose Route Frequency Provider Last Rate Last Dose  . acetaminophen (TYLENOL) tablet 650 mg  650 mg Oral Q6H PRN Jackelyn Poling, NP      . alum & mag hydroxide-simeth (MAALOX/MYLANTA) 200-200-20 MG/5ML suspension 30 mL  30 mL Oral Q4H PRN Nira Conn A, NP      . hydrOXYzine (ATARAX/VISTARIL) tablet 25 mg  25 mg Oral Q6H PRN Nira Conn A, NP      . loperamide (IMODIUM) capsule 2-4 mg  2-4 mg Oral PRN Nira Conn A, NP      . LORazepam (ATIVAN) tablet 1 mg  1 mg Oral Q6H PRN Nira Conn A, NP      . LORazepam (ATIVAN) tablet 1 mg  1 mg Oral QID Nira Conn A, NP   1 mg at 06/22/18 1658   Followed by  . [START ON 06/23/2018] LORazepam (ATIVAN) tablet 1 mg  1 mg Oral TID Jackelyn Poling, NP       Followed by  . [START ON 06/24/2018] LORazepam (ATIVAN) tablet 1 mg  1 mg Oral BID Jackelyn Poling, NP       Followed by  . [START ON 06/26/2018] LORazepam (ATIVAN) tablet 1 mg  1 mg Oral Daily Nira Conn A, NP      . magnesium hydroxide (MILK OF MAGNESIA) suspension  30 mL  30 mL Oral Daily PRN Nira Conn A, NP      . multivitamin with minerals tablet 1 tablet  1 tablet Oral Daily Nira Conn A, NP   1 tablet at 06/22/18 1726  . ondansetron (ZOFRAN-ODT) disintegrating tablet 4 mg  4 mg Oral Q6H PRN Jackelyn Poling, NP      . Melene Muller ON 06/23/2018] thiamine (VITAMIN B-1) tablet 100 mg  100 mg Oral Daily Nira Conn A, NP      . traZODone (DESYREL) tablet 50 mg  50 mg Oral QHS PRN Jackelyn Poling, NP       PTA Medications: Medications Prior to Admission  Medication Sig Dispense Refill Last Dose  . amLODipine (NORVASC) 10 MG tablet Take 10 mg by mouth daily.   Past Week at Unknown time  . docusate sodium (COLACE) 100 MG capsule Take 1 capsule (100  mg total) by mouth 2 (two) times daily. (Patient not taking: Reported on 02/17/2018) 10 capsule 0 Not Taking at Unknown time  . famotidine (PEPCID) 10 MG tablet Take 10 mg by mouth 2 (two) times daily as needed for heartburn.    Past Week at Unknown time  . FLUoxetine (PROZAC) 40 MG capsule Take 40 mg by mouth daily.   06/21/2018 at Unknown time  . folic acid (FOLVITE) 1 MG tablet Take 1 tablet (1 mg total) by mouth daily. 30 tablet 0 Past Month at Unknown time  . hydrocortisone (ANUSOL-HC) 25 MG suppository Place 1 suppository (25 mg total) rectally 2 (two) times daily. (Patient not taking: Reported on 02/17/2018) 12 suppository 0 Not Taking at Unknown time  . hydrOXYzine (VISTARIL) 25 MG capsule Take 25 mg by mouth every 8 (eight) hours as needed for anxiety.    Past Week at Unknown time  . ibuprofen (ADVIL) 200 MG tablet Take 800 mg by mouth every 6 (six) hours as needed for moderate pain.    Past Week at Unknown time  . omeprazole (PRILOSEC OTC) 20 MG tablet Take 1 tablet (20 mg total) by mouth daily. 30 tablet 1 04/16/2018 at Unknown time  . prazosin (MINIPRESS) 1 MG capsule Take 1 mg by mouth at bedtime.   Past Week at Unknown time  . thiamine 100 MG tablet Take 1 tablet (100 mg total) by mouth daily. 30 tablet 0 04/16/2018 at Unknown time  . traZODone (DESYREL) 50 MG tablet Take 50 mg by mouth at bedtime.   Past Week at Unknown time    Musculoskeletal: Strength & Muscle Tone: within normal limits- minimal distal tremors, no restlessness or agitation Gait & Station: normal Patient leans: N/A  Psychiatric Specialty Exam: Physical Exam  Review of Systems  Constitutional: Negative for chills and fever.  HENT: Negative.   Eyes: Negative.   Respiratory: Negative for cough and shortness of breath.   Cardiovascular: Negative for chest pain.  Gastrointestinal: Positive for heartburn. Negative for blood in stool, diarrhea, nausea and vomiting.  Genitourinary: Negative.   Musculoskeletal:  Negative.   Skin: Negative.  Negative for rash.  Neurological: Positive for headaches. Negative for seizures.  Endo/Heme/Allergies: Negative.   Psychiatric/Behavioral: Positive for depression and substance abuse.  All other systems reviewed and are negative.   There were no vitals taken for this visit.There is no height or weight on file to calculate BMI.  General Appearance: Well Groomed  Eye Contact:  Good  Speech:  Normal Rate  Volume:  Normal  Mood:  vaguely depressed   Affect:  constricted, but  reactive, tends to brighten during session  Thought Process:  Linear and Descriptions of Associations: Intact  Orientation:  Full (Time, Place, and Person)  Thought Content:  denies hallucinations, no delusions   Suicidal Thoughts:  No denies suicidal or self injurious ideations, contracts for safety on unit, denies homicidal or violent ideations and specifically also denies any HI towards GF   Homicidal Thoughts:  No  Memory:  recent and remote grossly intact   Judgement:  Fair  Insight:  Fair  Psychomotor Activity:  Normal- minimal distal tremors, no diaphoresis, no restlessness or agitation  Concentration:  Concentration: Good and Attention Span: Good  Recall:  Good  Fund of Knowledge:  Good  Language:  Good  Akathisia:  Negative  Handed:  Right  AIMS (if indicated):     Assets:  Communication Skills Desire for Improvement Resilience  ADL's:  Intact  Cognition:  WNL  Sleep:       Treatment Plan Summary: Daily contact with patient to assess and evaluate symptoms and progress in treatment, Medication management, Plan inpatient treatment and medications as below  Observation Level/Precautions:  15 minute checks  Laboratory:  as needed   Psychotherapy:  Milieu, group therapy  Medications:  Ativan detox protocol to address alcohol WDL We discussed medication options - at this time will continue Prozac at 40 mgrs QDAY ( had been recently increased to this dose  , denies side  effects. As noted does endorse history of ED/sexual dysfunction, but does not currently think it is related to SSRI, and prefers to continue this med ). Continue Minipress 1 mgr QHS for PTSD related nightmares   Consultations: as needed    Discharge Concerns:  -  Estimated LOS: 5 days   Other:     Physician Treatment Plan for Primary Diagnosis:  MDD, PTSD, consider also alcohol induced mood disorder Long Term Goal(s): Improvement in symptoms so as ready for discharge  Short Term Goals: Ability to identify changes in lifestyle to reduce recurrence of condition will improve, Ability to verbalize feelings will improve, Ability to disclose and discuss suicidal ideas, Ability to demonstrate self-control will improve, Ability to identify and develop effective coping behaviors will improve and Ability to maintain clinical measurements within normal limits will improve  Physician Treatment Plan for Secondary Diagnosis: Alcohol Use Disorder Long Term Goal(s): Improvement in symptoms so as ready for discharge  Short Term Goals: Ability to identify changes in lifestyle to reduce recurrence of condition will improve and Ability to identify triggers associated with substance abuse/mental health issues will improve  I certify that inpatient services furnished can reasonably be expected to improve the patient's condition.    Craige Cotta, MD 5/3/20205:34 PM

## 2018-06-22 NOTE — ED Notes (Signed)
Pt does not want to go for in-pt treatment. Reassured pt that it will do him good based on recent events and his level of withdrawal.
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
## 2018-06-22 NOTE — Progress Notes (Signed)
Asked by MHT to assist with admitting pt, when writer first met pt he is observed in the [facility] search room. He is disorganized, extremely agitated , rummaging through his personal beongings bag, looking for his phone, he pulls different items out of the bag, saying "I gotta have thi....its time for me to get outta here". He pauses every few minutes , as if he is hearing something and does not repsond to staff in search room..he is paranoid, guarded, suspicious and very angry about being admitted, why he is here, why he is IVC'd. He is allowed to call his GF and he says " get up here and get me [place] here". At one point while he is heatedly speaking to [place] (cell on speaker ) she tries to explain to pt that he has been IVC'd, she doesn't know why and he becomes irate, demanding to be aLLOWED TO LEAVE.Marland Kitchen Security and [facility] to assist with search and completion of securng pt's contraband belongings in the locker ( this was done successfully) and after several minutes, pt was reluctantly accompanied to the 300 ahll. HE refused to anser any assessment questions. He is irrational, he will not hear nurses's explanation of policy ./ procedure, etc..  says " just leave me alone". After being on the unit approx 3-4  Hrs, pt is much less angry. While he cont to be upset about being IVC'd, he does meet with the physician, he takes po ativan and he is resting in his room. He is willing to contract verbally with this wrtier for safety.

## 2018-06-22 NOTE — ED Notes (Signed)
Encouraging fluids °

## 2018-06-22 NOTE — ED Notes (Signed)
Pt reports detoxing from alcohol with CIWA = 11.  This reported and appropriate medications given. Pt did prefer the IM to the po Ativan.
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
## 2018-06-22 NOTE — Tx Team (Signed)
Initial Treatment Plan 06/22/2018 6:59 PM JAYSE OBST KKX:[id number]    PATIENT STRESSORS: Educational concerns Financial difficulties   PATIENT STRENGTHS: Ability for insight   PATIENT IDENTIFIED PROBLEMS: Depression with SI  Substance Abuse            " I dont belong here"  " I gotta get [place] here"     DISCHARGE CRITERIA:  Ability to meet basic life and health needs Adequate post-discharge living arrangements  PRELIMINARY DISCHARGE PLAN: Attend aftercare/continuing care group  PATIENT/FAMILY INVOLVEMENT: This treatment plan has been presented to and reviewed with the patient, [name], and/or family member, .  The patient and family have been given the opportunity to ask questions and make suggestions.  Rich Brave, RN 06/22/2018, 6:59 PM

## 2018-06-22 NOTE — ED Notes (Signed)
Transported to [facility] by [employer]. All belongings returned to pt. Pt did not want to go and was reluctant when [employer] came.

## 2018-06-22 NOTE — Patient Outreach (Signed)
CPSS processed with Pt to gain information to better assist Pt once he leaves the ED. CPSS was made aware that he has been to a facility in [place] [employer] and that he wants to go home and attend a few Virtual [employer] CPSS gave Pt several AA meeting contact information as well as CPSS contact information to stay in contact in the community.

## 2018-06-22 NOTE — ED Notes (Signed)
Called report to [employer] at [facility]. Called [employer].

## 2018-06-22 NOTE — Patient Instructions (Signed)
Follow up with CPSS and contact AA meetings to get involved with the Virtual meetings.

## 2018-06-23 LAB — LIPID PANEL
Cholesterol: 195 mg/dL (ref 0–200)
HDL: 69 mg/dL (ref >40)
LDL Cholesterol: 117 mg/dL — ABNORMAL HIGH (ref 0–99)
Total CHOL/HDL Ratio: 2.8 RATIO
Triglycerides: 47 mg/dL (ref <150)
VLDL: 9 mg/dL (ref 0–40)

## 2018-06-23 LAB — AMMONIA: Ammonia: 42 umol/L — ABNORMAL HIGH (ref 9–35)

## 2018-06-23 LAB — HEMOGLOBIN A1C
Hgb A1c MFr Bld: 5 % (ref 4.8–5.6)
Mean Plasma Glucose: 96.8 mg/dL

## 2018-06-23 LAB — TSH: TSH: 1.151 u[IU]/mL (ref 0.350–4.500)

## 2018-06-23 MED ORDER — MELOXICAM 7.5 MG PO TABS
7.5000 mg | ORAL_TABLET | Freq: Every day | ORAL | Status: DC
  Administered 2018-06-23 – 2018-06-24 (×2): 7.5 mg via ORAL
  Filled 2018-06-23 (×4): qty 1

## 2018-06-23 NOTE — Progress Notes (Signed)
[place] NOVEL CORONAVIRUS (COVID-19) DAILY CHECK-OFF SYMPTOMS - answer yes or no to each - every day NO YES  Have you had a fever in the past 24 hours?  . Fever (Temp > 37.80C / 100F) X   Have you had any of these symptoms in the past 24 hours? . New Cough .  Sore Throat  .  Shortness of Breath .  Difficulty Breathing .  Unexplained Body Aches   X   Have you had any one of these symptoms in the past 24 hours not related to allergies?   . Runny Nose .  Nasal Congestion .  Sneezing   X   If you have had runny nose, nasal congestion, sneezing in the past 24 hours, has it worsened?  X   EXPOSURES - check yes or no X   Have you traveled outside the state in the past 14 days?  X   Have you been in contact with someone with a confirmed diagnosis of COVID-19 or PUI in the past 14 days without wearing appropriate PPE?  X   Have you been living in the same home as a person with confirmed diagnosis of COVID-19 or a PUI (household contact)?    X   Have you been diagnosed with COVID-19?    X              What to do next: Answered NO to all: Answered YES to anything:   Proceed with unit schedule Follow the [facility] Inpatient Flowsheet.   

## 2018-06-23 NOTE — BHH Suicide Risk Assessment (Signed)
[facility] INPATIENT:  Family/Significant Other Suicide Prevention Education  Suicide Prevention Education:  Education Completed; Pt's fiance, [name] [phone]), has been identified by the patient as the family member/significant other with whom the patient will be residing, and identified as the person(s) who will aid the patient in the event of a mental health crisis (suicidal ideations/suicide attempt).  With written consent from the patient, the family member/significant other has been provided the following suicide prevention education, prior to the and/or following the discharge of the patient.  The suicide prevention education provided includes the following:  Suicide risk factors  Suicide prevention and interventions  National Suicide Hotline telephone number  [facility] assessment telephone number  [facility] Emergency Assistance 911  [facility] and/or Residential Mobile Crisis Unit telephone number  Request made of family/significant other to:  Remove weapons (e.g., guns, rifles, knives), all items previously/currently identified as safety concern.    Remove drugs/medications (over-the-counter, prescriptions, illicit drugs), all items previously/currently identified as a safety concern.  The family member/significant other verbalizes understanding of the suicide prevention education information provided.  The family member/significant other agrees to remove the items of safety concern listed above.   CSW contacted pt's fiance, [name]. Pt's fiance stated that the pt likes to be busy and she feels that him being home due to COVID is causing a lot of issues and concerns. Pt's fiance reports that the pt needs referral to a shoulder doctor because he is in so much pain. Pt's fiance reports that she thinks that he drinks to self-medicate for his shoulder pain. Pt's fiance reports that he will be coming back home to live with her. She reports that he  has went to residential treatment in the past (detox at [facility] and another residential treatment center a week later). Pt's fiance reports that he goes to [facility] for med management due to being in the emergency room last month due to being intoxicated. Pt's fiance reports that she feels he should do a 90 day program in [place] (IOP).    [name] 06/23/2018, 3:30 PM
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
## 2018-06-23 NOTE — Tx Team (Signed)
Interdisciplinary Treatment and Diagnostic Plan Update  06/23/2018 Time of Session: 0903 Nathan BooksJustin L Barnes MRN: [id number]  Principal Diagnosis: <principal problem not specified>  Secondary Diagnoses: Active Problems:   Severe recurrent major depression without psychotic features (HCC)   Current Medications:  Current Facility-Administered Medications  Medication Dose Route Frequency Provider Last Rate Last Dose  . acetaminophen (TYLENOL) tablet 650 mg  650 mg Oral Q6H PRN [name], [name], NP      . alum & mag hydroxide-simeth (MAALOX/MYLANTA) 200-200-20 MG/5ML suspension 30 mL  30 mL Oral Q4H PRN [name], [name], NP      . amLODipine (NORVASC) tablet 10 mg  10 mg Oral Daily Cobos, [name], MD   10 mg at 06/23/18 0810  . FLUoxetine (PROZAC) capsule 40 mg  40 mg Oral Daily Cobos, [name], MD   40 mg at 06/23/18 0810  . hydrOXYzine (ATARAX/VISTARIL) tablet 25 mg  25 mg Oral Q6H PRN [name], [name], NP      . LORazepam (ATIVAN) tablet 1 mg  1 mg Oral Q6H PRN [name], [name], NP      . LORazepam (ATIVAN) tablet 1 mg  1 mg Oral QID [name], [name], NP   1 mg at 06/23/18 0810   Followed by  . LORazepam (ATIVAN) tablet 1 mg  1 mg Oral TID [name], [name], NP       Followed by  . [START ON 06/24/2018] LORazepam (ATIVAN) tablet 1 mg  1 mg Oral BID [name], [name], NP       Followed by  . [START ON 06/26/2018] LORazepam (ATIVAN) tablet 1 mg  1 mg Oral Daily [name], [name], NP      . magnesium hydroxide (MILK OF MAGNESIA) suspension 30 mL  30 mL Oral Daily PRN [name], [name], NP      . multivitamin with minerals tablet 1 tablet  1 tablet Oral Daily [name], [name], NP   1 tablet at 06/23/18 0810  . ondansetron (ZOFRAN-ODT) disintegrating tablet 4 mg  4 mg Oral Q6H PRN [name], [name], NP      . pantoprazole (PROTONIX) EC tablet 40 mg  40 mg Oral Daily Cobos, [name], MD   40 mg at 06/23/18 0811  . prazosin (MINIPRESS) capsule 1 mg  1 mg Oral QHS Cobos, Fernando A, MD      . thiamine (VITAMIN B-1) tablet  100 mg  100 mg Oral Daily [name], [name], NP   100 mg at 06/23/18 0810  . traZODone (DESYREL) tablet 25 mg  25 mg Oral QHS PRN Cobos, [name], MD   25 mg at 06/22/18 2216   PTA Medications: Medications Prior to Admission  Medication Sig Dispense Refill Last Dose  . amLODipine (NORVASC) 10 MG tablet Take 10 mg by mouth daily.   Unknown at Unknown time  . docusate sodium (COLACE) 100 MG capsule Take 1 capsule (100 mg total) by mouth 2 (two) times daily. (Patient not taking: Reported on 02/17/2018) 10 capsule 0 Unknown at Unknown time  . famotidine (PEPCID) 10 MG tablet Take 10 mg by mouth 2 (two) times daily as needed for heartburn.    Unknown at Unknown time  . FLUoxetine (PROZAC) 40 MG capsule Take 40 mg by mouth daily.   Unknown at Unknown time  . folic acid (FOLVITE) 1 MG tablet Take 1 tablet (1 mg total) by mouth daily. 30 tablet 0 Unknown at Unknown time  . hydrocortisone (ANUSOL-HC) 25 MG suppository  Place 1 suppository (25 mg total) rectally 2 (two) times daily. (Patient not taking: Reported on 02/17/2018) 12 suppository 0 Unknown at Unknown time  . hydrOXYzine (VISTARIL) 25 MG capsule Take 25 mg by mouth every 8 (eight) hours as needed for anxiety.    Unknown at Unknown time  . ibuprofen (ADVIL) 200 MG tablet Take 800 mg by mouth every 6 (six) hours as needed for moderate pain.    Unknown at Unknown time  . omeprazole (PRILOSEC OTC) 20 MG tablet Take 1 tablet (20 mg total) by mouth daily. 30 tablet 1 04/16/2018 at Unknown time  . prazosin (MINIPRESS) 1 MG capsule Take 1 mg by mouth at bedtime.   Unknown at Unknown time  . thiamine 100 MG tablet Take 1 tablet (100 mg total) by mouth daily. 30 tablet 0 Unknown at Unknown time  . traZODone (DESYREL) 50 MG tablet Take 50 mg by mouth at bedtime.   Unknown at Unknown time    Patient Stressors: Educational concerns Financial difficulties  Patient Strengths: Ability for insight  Treatment Modalities: Medication Management, Group therapy,  Case management,  1 to 1 session with clinician, Psychoeducation, Recreational therapy.   Physician Treatment Plan for Primary Diagnosis: <principal problem not specified> Long Term Goal(s): Improvement in symptoms so as ready for discharge Improvement in symptoms so as ready for discharge   Short Term Goals: Ability to identify changes in lifestyle to reduce recurrence of condition will improve Ability to verbalize feelings will improve Ability to disclose and discuss suicidal ideas Ability to demonstrate self-control will improve Ability to identify and develop effective coping behaviors will improve Ability to maintain clinical measurements within normal limits will improve Ability to identify changes in lifestyle to reduce recurrence of condition will improve Ability to identify triggers associated with substance abuse/mental health issues will improve  Medication Management: Evaluate patient's response, side effects, and tolerance of medication regimen.  Therapeutic Interventions: 1 to 1 sessions, Unit Group sessions and Medication administration.  Evaluation of Outcomes: Progressing  Physician Treatment Plan for Secondary Diagnosis: Active Problems:   Severe recurrent major depression without psychotic features (HCC)  Long Term Goal(s): Improvement in symptoms so as ready for discharge Improvement in symptoms so as ready for discharge   Short Term Goals: Ability to identify changes in lifestyle to reduce recurrence of condition will improve Ability to verbalize feelings will improve Ability to disclose and discuss suicidal ideas Ability to demonstrate self-control will improve Ability to identify and develop effective coping behaviors will improve Ability to maintain clinical measurements within normal limits will improve Ability to identify changes in lifestyle to reduce recurrence of condition will improve Ability to identify triggers associated with substance abuse/mental  health issues will improve     Medication Management: Evaluate patient's response, side effects, and tolerance of medication regimen.  Therapeutic Interventions: 1 to 1 sessions, Unit Group sessions and Medication administration.  Evaluation of Outcomes: Progressing   RN Treatment Plan for Primary Diagnosis: <principal problem not specified> Long Term Goal(s): Knowledge of disease and therapeutic regimen to maintain health will improve  Short Term Goals: Ability to participate in decision making will improve, Ability to verbalize feelings will improve, Ability to disclose and discuss suicidal ideas, Ability to identify and develop effective coping behaviors will improve and Compliance with prescribed medications will improve  Medication Management: RN will administer medications as ordered by provider, will assess and evaluate patient's response and provide education to patient for prescribed medication. RN will report any adverse and/or side  effects to prescribing provider.  Therapeutic Interventions: 1 on 1 counseling sessions, Psychoeducation, Medication administration, Evaluate responses to treatment, Monitor vital signs and CBGs as ordered, Perform/monitor CIWA, COWS, AIMS and Fall Risk screenings as ordered, Perform wound care treatments as ordered.  Evaluation of Outcomes: Progressing   LCSW Treatment Plan for Primary Diagnosis: <principal problem not specified> Long Term Goal(s): Safe transition to appropriate next level of care at discharge, Engage patient in therapeutic group addressing interpersonal concerns.  Short Term Goals: Engage patient in aftercare planning with referrals and resources and Increase skills for wellness and recovery  Therapeutic Interventions: Assess for all discharge needs, 1 to 1 time with Social worker, Explore available resources and support systems, Assess for adequacy in community support network, Educate family and significant other(s) on suicide  prevention, Complete Psychosocial Assessment, Interpersonal group therapy.  Evaluation of Outcomes: Progressing   Progress in Treatment: Attending groups: No. Participating in groups: No. Taking medication as prescribed: Yes. Toleration medication: Yes. Family/Significant other contact made: No, will contact:  will contact if patient gives consent to contact Patient understands diagnosis: Yes. Discussing patient identified problems/goals with staff: Yes. Medical problems stabilized or resolved: Yes. Denies suicidal/homicidal ideation: Yes. Issues/concerns per patient self-inventory: No. Other:    New problem(s) identified: No, Describe:  None  New Short Term/Long Term Goal(s):  Patient Goals:  "Get back home and get to work"  Discharge Plan or Barriers:   Reason for Continuation of Hospitalization: Aggression Medication stabilization  Estimated Length of Stay:3-5 days  Attendees: Patient: 06/23/2018  Physician: Dr. Jama Flavors, MD 06/23/2018   Nursing: Arlyss Repress, RN 06/23/2018  RN Care Manager: 06/23/2018  Social Worker: Stephannie Peters, LCSW 06/23/2018   Recreational Therapist:  06/23/2018   Other:  06/23/2018   Other:  06/23/2018  Other: 06/23/2018    Scribe for Treatment Team: [name], LCSW 06/23/2018 9:31 AM

## 2018-06-23 NOTE — Progress Notes (Signed)
Adult Psychoeducational Group Note  Date:  06/23/2018 Time:  11:35 PM  Group Topic/Focus:  Wrap-Up Group:   The focus of this group is to help patients review their daily goal of treatment and discuss progress on daily workbooks.  Participation Level:  Active  Participation Quality:  Appropriate  Affect:  Appropriate  Cognitive:  Appropriate  Insight: Appropriate  Engagement in Group:  Engaged  Modes of Intervention:  Discussion  Additional Comments:  Patient attended group and said that his day was a 8. His coping skills were going outdoors with the group, reading and relaxing.   [name] W Ajeenah Heiny 06/23/2018, 11:35 PM

## 2018-06-23 NOTE — Progress Notes (Signed)
Pt attended spiritual care group on grief and loss facilitated by chaplain Jacqualynn Parco   Group opened with brief discussion and psycho-social ed around grief and loss in relationships and in relation to self.  Group members engaged in facilitated dialog and support  - identifying life patterns, circumstances, changes that cause losses.  Group goal of establishing open and affirming space for members to share loss and experience with grief, normalize grief experience and provide psycho social education and grief support. 

## 2018-06-23 NOTE — [facility] Suicide Risk Assessment (Signed)
[facility] INPATIENT:  Family/Significant Other Suicide Prevention Education  Suicide Prevention Education:  Contact Attempts: with fiance, [name] [phone] has been identified by the patient as the family member/significant other with whom the patient will be residing, and identified as the person(s) who will aid the patient in the event of a mental health crisis.  With written consent from the patient, two attempts were made to provide suicide prevention education, prior to and/or following the patient's discharge.  We were unsuccessful in providing suicide prevention education.  A suicide education pamphlet was given to the patient to share with family/significant other.  Date and time of first attempt:m 06/23/2018 at 2:33pm. The voicemail box was full and CSW was unable to leave a voicemail.  [name] 06/23/2018, 2:34 PM

## 2018-06-23 NOTE — Plan of Care (Signed)
  Problem: Education: Goal: Knowledge of disease or condition will improve Outcome: Progressing Goal: Understanding of discharge needs will improve Outcome: Progressing   Problem: Health Behavior/Discharge Planning: Goal: Ability to identify changes in lifestyle to reduce recurrence of condition will improve Outcome: Progressing   

## 2018-06-23 NOTE — Progress Notes (Signed)
Adult Psychoeducational Group Note  Date:  06/23/2018 Time:  7:19 PM  Group Topic/Focus:  Goals Group:   The focus of this group is to help patients establish daily goals to achieve during treatment and discuss how the patient can incorporate goal setting into their daily lives to aide in recovery.  Participation Level:  Active  Participation Quality:  Appropriate  Affect:  Appropriate  Cognitive:  Alert  Insight: Appropriate  Engagement in Group:  Engaged  Modes of Intervention:  Discussion  Additional Comments:  Pt attended group and participated in discussion.  [name] 06/23/2018, 7:19 PM

## 2018-06-23 NOTE — Progress Notes (Addendum)
[facility] MD Progress Note  06/23/2018 3:14 PM [name]  MRN:  [id number]   Subjective: Patient reports today that he is feeling better.  He states that he is just fine and he is ready to be discharged home.  He denies any suicidal or homicidal ideations and denies any hallucinations.  Patient states he does not have any withdrawals and he is ready to be discharged.  He claims that he has a group to attend tomorrow for AA and also has a interview with [employer] and that he needs to get this job.  Patient states that he slept better last night and his appetite has been good.  Patient does have one complaint about his shoulder hurting.  He states that he had fallen on it years ago and he has arthritis in it and he has pain in it from time to time and has been hurting for several months.  Objective: Patient's chart and findings reviewed and discussed with treatment team.  Patient presents in the day room and is pleasant, calm, and cooperative.  Patient has been attending groups and has been interacting with peers and staff appropriately.  Patient denies having any withdrawal symptoms but still shows to have a tachycardic heartbeat.  Due to patient requesting to leave soon I have decreased his Ativan detox protocol to as needed only for a more thorough evaluation of his withdrawal symptoms.Started patient on Mobic 7.5 mg Daily for shoulder pain.  Discussed patient's discharge plan with Dr. Jama Flavors.  Due to Ativan detox protocol being decreased will evaluate patient through the remainder of the day and overnight to ensure there are no significant withdrawal symptoms.  It is been discussed that if patient has a good day and does not show any significant withdrawal symptoms can be discharged tomorrow morning and the patient has requested to be discharged early because his first appointment is at 10 AM.   Principal Problem: Severe recurrent major depression without psychotic features (HCC) Diagnosis: Principal  Problem:   Severe recurrent major depression without psychotic features (HCC)  Total Time spent with patient: 30 minutes  Past Psychiatric History: See H&P  Past Medical History:  Past Medical History:  Diagnosis Date  . Hypertension     Past Surgical History:  Procedure Laterality Date  . CHOLECYSTECTOMY    . GASTRIC BYPASS     Family History:  Family History  Problem Relation Age of Onset  . Leukemia Mother    Family Psychiatric  History: See H&P Social History:  Social History   Substance and Sexual Activity  Alcohol Use Yes   Comment: 3 pints liquor daily     Social History   Substance and Sexual Activity  Drug Use Not Currently    Social History   Socioeconomic History  . Marital status: Single    Spouse name: Not on file  . Number of children: Not on file  . Years of education: Not on file  . Highest education level: Not on file  Occupational History  . Not on file  Social Needs  . Financial resource strain: Not on file  . Food insecurity:    Worry: Not on file    Inability: Not on file  . Transportation needs:    Medical: Not on file    Non-medical: Not on file  Tobacco Use  . Smoking status: Current Some Day Smoker    Types: Cigars  . Smokeless tobacco: Never Used  . Tobacco comment: black and mild cigars  Substance  and Sexual Activity  . Alcohol use: Yes    Comment: 3 pints liquor daily  . Drug use: Not Currently  . Sexual activity: Not on file  Lifestyle  . Physical activity:    Days per week: Not on file    Minutes per session: Not on file  . Stress: Not on file  Relationships  . Social connections:    Talks on phone: Not on file    Gets together: Not on file    Attends religious service: Not on file    Active member of club or organization: Not on file    Attends meetings of clubs or organizations: Not on file    Relationship status: Not on file  Other Topics Concern  . Not on file  Social History Narrative  . Not on file    Additional Social History:    Pain Medications: see MAR Prescriptions: see MAR Over the Counter: see MAR History of alcohol / drug use?: Yes Longest period of sobriety (when/how long): 34 days Negative Consequences of Use: Financial, Personal relationships Withdrawal Symptoms: Agitation, Irritability, Nausea / Vomiting, Tremors, Blackouts, Sweats Name of Substance 1: Alcohol 1 - Age of First Use: Adolescent 1 - Amount (size/oz): 1 pint liquor 1 - Frequency: 2-3 times per week 1 - Duration: Ongoing, 2 weeks this episode 1 - Last Use / Amount: 06/21/18, 1 pint                  Sleep: Good  Appetite:  Good  Current Medications: Current Facility-Administered Medications  Medication Dose Route Frequency Provider Last Rate Last Dose  . acetaminophen (TYLENOL) tablet 650 mg  650 mg Oral Q6H PRN [name], [name], NP   650 mg at 06/23/18 1210  . alum & mag hydroxide-simeth (MAALOX/MYLANTA) 200-200-20 MG/5ML suspension 30 mL  30 mL Oral Q4H PRN [name], [name], NP      . amLODipine (NORVASC) tablet 10 mg  10 mg Oral Daily Cobos, [name], MD   10 mg at 06/23/18 0810  . FLUoxetine (PROZAC) capsule 40 mg  40 mg Oral Daily Cobos, [name], MD   40 mg at 06/23/18 0810  . hydrOXYzine (ATARAX/VISTARIL) tablet 25 mg  25 mg Oral Q6H PRN [name], [name], NP      . LORazepam (ATIVAN) tablet 1 mg  1 mg Oral Q6H PRN [name], [name], NP      . magnesium hydroxide (MILK OF MAGNESIA) suspension 30 mL  30 mL Oral Daily PRN [name], [name], NP      . meloxicam (MOBIC) tablet 7.5 mg  7.5 mg Oral Daily Money, Travis B, FNP      . multivitamin with minerals tablet 1 tablet  1 tablet Oral Daily [name], [name], NP   1 tablet at 06/23/18 0810  . ondansetron (ZOFRAN-ODT) disintegrating tablet 4 mg  4 mg Oral Q6H PRN [name], [name], NP      . pantoprazole (PROTONIX) EC tablet 40 mg  40 mg Oral Daily Cobos, [name], MD   40 mg at 06/23/18 0811  . prazosin (MINIPRESS) capsule 1 mg  1 mg Oral QHS Cobos,  Fernando A, MD      . thiamine (VITAMIN B-1) tablet 100 mg  100 mg Oral Daily [name], [name], NP   100 mg at 06/23/18 0810  . traZODone (DESYREL) tablet 25 mg  25 mg Oral QHS PRN Cobos, [name], MD   25 mg at 06/22/18 2216  Lab Results:  Results for orders placed or performed during the hospital encounter of 06/22/18 (from the past 48 hour(s))  TSH     Status: None   Collection Time: 06/23/18  6:29 AM  Result Value Ref Range   TSH 1.151 0.350 - 4.500 uIU/mL    Comment: Performed by a 3rd Generation assay with a functional sensitivity of <=0.01 uIU/mL. Performed at [facility], [address] [place], [place] [address]   Ammonia     Status: Abnormal   Collection Time: 06/23/18  6:29 AM  Result Value Ref Range   Ammonia 42 (H) 9 - 35 umol/L    Comment: Performed at [facility], [address], [place], [place] [address]  Hemoglobin A1c     Status: None   Collection Time: 06/23/18  6:29 AM  Result Value Ref Range   Hgb A1c MFr Bld 5.0 4.8 - 5.6 %    Comment: (NOTE) Pre diabetes:          5.7%-6.4% Diabetes:              >6.4% Glycemic control for   <7.0% adults with diabetes    Mean Plasma Glucose 96.8 mg/dL    Comment: Performed at [facility], [address], [place], [place] [address]  Lipid panel     Status: Abnormal   Collection Time: 06/23/18  6:29 AM  Result Value Ref Range   Cholesterol 195 0 - 200 mg/dL   Triglycerides 47 <166 mg/dL   HDL 69 >06 mg/dL   Total CHOL/HDL Ratio 2.8 RATIO   VLDL 9 0 - 40 mg/dL   LDL Cholesterol 004 (H) 0 - 99 mg/dL    Comment:        Total Cholesterol/HDL:CHD Risk Coronary Heart Disease Risk Table                     Men   Women  1/2 Average Risk   3.4   3.3  Average Risk       5.0   4.4  2 X Average Risk   9.6   7.1  3 X Average Risk  23.4   11.0        Use the calculated Patient Ratio above and the CHD Risk Table to determine the patient's CHD Risk.        ATP III  CLASSIFICATION (LDL):  <100     mg/dL   Optimal  599-774  mg/dL   Near or Above                    Optimal  130-159  mg/dL   Borderline  142-395  mg/dL   High  >320     mg/dL   Very High Performed at [facility], [address] [place], [place] [address]     Blood Alcohol level:  Lab Results  Component Value Date   ETH 263 (H) 06/21/2018   ETH 341 (HH) 04/16/2018    Metabolic Disorder Labs: Lab Results  Component Value Date   HGBA1C 5.0 06/23/2018   MPG 96.8 06/23/2018   No results found for: PROLACTIN Lab Results  Component Value Date   CHOL 195 06/23/2018   TRIG 47 06/23/2018   HDL 69 06/23/2018   CHOLHDL 2.8 06/23/2018   VLDL 9 06/23/2018   LDLCALC 117 (H) 06/23/2018    Physical Findings: AIMS: Facial and Oral Movements Muscles of Facial Expression: None,  normal Lips and Perioral Area: None, normal Jaw: None, normal Tongue: None, normal,Extremity Movements Upper (arms, wrists, hands, fingers): None, normal Lower (legs, knees, ankles, toes): None, normal, Trunk Movements Neck, shoulders, hips: None, normal, Overall Severity Severity of abnormal movements (highest score from questions above): None, normal Incapacitation due to abnormal movements: None, normal Patient's awareness of abnormal movements (rate only patient's report): No Awareness, Dental Status Current problems with teeth and/or dentures?: No Does patient usually wear dentures?: No  CIWA:  CIWA-Ar Total: 0 COWS:  COWS Total Score: 13  Musculoskeletal: Strength & Muscle Tone: within normal limits Gait & Station: normal Patient leans: N/A  Psychiatric Specialty Exam: Physical Exam  Nursing note and vitals reviewed. Constitutional: He is oriented to person, place, and time. He appears well-developed and well-nourished.  Respiratory: Effort normal.  Musculoskeletal: Normal range of motion.  Neurological: He is alert and oriented to person, place, and time.  Skin: Skin is  warm.    Review of Systems  Constitutional: Negative.   HENT: Negative.   Eyes: Negative.   Respiratory: Negative.   Cardiovascular: Negative.   Gastrointestinal: Negative.   Genitourinary: Negative.   Musculoskeletal: Positive for joint pain (shoulder).  Skin: Negative.   Neurological: Negative.   Endo/Heme/Allergies: Negative.   Psychiatric/Behavioral: Positive for substance abuse.    Blood pressure 113/84, pulse (!) 127, temperature 98.5 F (36.9 C), temperature source Oral, resp. rate 16, height  (1.778 m), weight 68 kg, SpO2 98 %.Body mass index is 21.52 kg/m.  General Appearance: Casual  Eye Contact:  Good  Speech:  Clear and Coherent and Normal Rate  Volume:  Normal  Mood:  Irritable  Affect:  Congruent  Thought Process:  Coherent and Descriptions of Associations: Intact  Orientation:  Full (Time, Place, and Person)  Thought Content:  WDL  Suicidal Thoughts:  No  Homicidal Thoughts:  No  Memory:  Immediate;   Good Recent;   Good Remote;   Good  Judgement:  Fair  Insight:  Fair  Psychomotor Activity:  Normal  Concentration:  Concentration: Good and Attention Span: Good  Recall:  Good  Fund of Knowledge:  Good  Language:  Good  Akathisia:  No  Handed:  Right  AIMS (if indicated):     Assets:  Communication Skills Desire for Improvement Financial Resources/Insurance Housing Physical Health Social Support Transportation  ADL's:  Intact  Cognition:  WNL  Sleep:  Number of Hours: 6.5   Problems Addressed: MDD severe without psychotic features  Treatment Plan Summary: Daily contact with patient to assess and evaluate symptoms and progress in treatment, Medication management and Plan is to:  Start Mobic 7.5 mg PO Daily for shoulder pain and arthritis Continue Prozac 40 mg PO Daily for MDD Decrease detox protocol to Ativan PRN for CIWA > 10 Continue Prazosin 1 mg PO QHS for PTSD Continue Vistaril 25 mg PO Q6H PRN for anxiety Continue Trazodone 50 mg  PO QHS PRN for insomnia Encourage group therapy participation Possible discharge tomorrow Continue Q15 minute safety checks  Gerlene Burdock Money, FNP 06/23/2018, 3:14 PM   Attest to NP Progress Note

## 2018-06-23 NOTE — Progress Notes (Signed)
Recreation Therapy Notes  Date:  5.4.20 Time: 0930 Location: 300 Hall Dayroom  Group Topic: Stress Management  Goal Area(s) Addresses:  Patient will identify positive stress management techniques. Patient will identify benefits of using stress management post d/c.  Intervention: Stress Management  Activity :  Meditation.  LRT introduced the stress management technique of meditation.  The meditation focused on being resilient in the face of adversity.  Patients were to listen as meditation played in order to engage in activity.  Education:  Stress Management, Discharge Planning.   Education Outcome: Acknowledges Education  Clinical Observations/Feedback: Pt did not attend group.     Caroll Rancher, LRT/CTRS         Lillia Abed, Makyia Erxleben A 06/23/2018 10:45 AM

## 2018-06-23 NOTE — Progress Notes (Signed)
Writer has observed patient in bed asleep. Writer woke him up and informed him of scheduled medications. He got up to take his medication along with a snack and returned to his room. Support given and safety maintained on unit with 15 min checks.

## 2018-06-23 NOTE — Progress Notes (Signed)
[name] attended wrap-up group. He denies SI/HI/AVH/Pain at present. Pt was flat in affect and mood. No new c/o's. Pt stays isolative in the milieu. Support offered. Will continue with POC.

## 2018-06-24 MED ORDER — ADULT MULTIVITAMIN W/MINERALS CH: 1.0000 | ORAL_TABLET | Freq: Every day | ORAL | Status: AC

## 2018-06-24 NOTE — Progress Notes (Addendum)
  [facility] Adult Case Management Discharge Plan :  Will you be returning to the same living situation after discharge:  Yes,  home At discharge, do you have transportation home?: Yes,  fiance picking up Do you have the ability to pay for your medications: No. Referred to [facility].  Release of information consent forms completed and in the chart; letter on chart.  Patient to Follow up at: Follow-up Information    [facility] Follow up.   Why:  Your appointment will be held over the phone on 06/27/2018 at 12pm Contact information: [address] [place], [place] [address] Phone: [phone] Fax: [phone]          Next level of care provider has access to [facility] Link:no  Safety Planning and Suicide Prevention discussed: Yes,  with fiance  Have you used any form of tobacco in the last 30 days? (Cigarettes, Smokeless Tobacco, Cigars, and/or Pipes): Patient Refused Screening  Has patient been referred to the Quitline?: Patient refused referral  Patient has been referred for addiction treatment: Yes  [name], LCSWA 06/24/2018, 9:10 AM

## 2018-06-24 NOTE — Progress Notes (Signed)
Patient ID: [name], male   DOB: [birth year], 48 y.o.   MRN: [id number]  [place] NOVEL CORONAVIRUS (COVID-19) DAILY CHECK-OFF SYMPTOMS - answer yes or no to each - every day NO YES  Have you had a fever in the past 24 hours?  . Fever (Temp > 37.80C / 100F) X   Have you had any of these symptoms in the past 24 hours? . New Cough .  Sore Throat  .  Shortness of Breath .  Difficulty Breathing .  Unexplained Body Aches   X   Have you had any one of these symptoms in the past 24 hours not related to allergies?   . Runny Nose .  Nasal Congestion .  Sneezing   X   If you have had runny nose, nasal congestion, sneezing in the past 24 hours, has it worsened?  X   EXPOSURES - check yes or no X   Have you traveled outside the state in the past 14 days?  X   Have you been in contact with someone with a confirmed diagnosis of COVID-19 or PUI in the past 14 days without wearing appropriate PPE?  X   Have you been living in the same home as a person with confirmed diagnosis of COVID-19 or a PUI (household contact)?    X   Have you been diagnosed with COVID-19?    X              What to do next: Answered NO to all: Answered YES to anything:   Proceed with unit schedule Follow the [facility] Inpatient Flowsheet.

## 2018-06-24 NOTE — Progress Notes (Signed)
   06/24/18 0500  Sleep  Number of Hours 4.5

## 2018-06-24 NOTE — Progress Notes (Signed)
Patient ID: [name], male   DOB: Mar 02, 1970, 48 y.o.   MRN: [id number]  Discharge Note  Patient denies SI/HI and states readiness for discharge.  Written and verbal discharge instructions reviewed with the patient. Patient accepting to information and verbalized understanding with no concerns. All belongings returned to patient from the unit and secured lockers. Patient has completed their Suicide Safety Plan and has been provided Suicide Prevention resources. Patient provided an opportunity to complete and return Patient Satisfaction Survey.   Patient was safely escorted to the lobby for discharge. Patient discharged from [facility] with prescriptions, personal belongings, follow-up appointment in place and discharge paperwork.

## 2018-06-24 NOTE — [facility] Suicide Risk Assessment (Signed)
[facility] Discharge Suicide Risk Assessment   Principal Problem: Severe recurrent major depression without psychotic features [facility]) Discharge Diagnoses: Principal Problem:   Severe recurrent major depression without psychotic features (HCC)   Total Time spent with patient: 15 minutes  Musculoskeletal: Strength & Muscle Tone: within normal limits Gait & Station: normal Patient leans: N/A  Psychiatric Specialty Exam: Review of Systems  Musculoskeletal: Positive for joint pain and myalgias.  All other systems reviewed and are negative.   Blood pressure (!) 134/103, pulse (!) 102, temperature 99.5 F (37.5 C), temperature source Oral, resp. rate 16, height 5\' 10"  (1.778 m), weight 68 kg, SpO2 98 %.Body mass index is 21.52 kg/m.  General Appearance: Casual  Eye Contact::  Good  Speech:  Normal Rate409  Volume:  Normal  Mood:  Euthymic  Affect:  Congruent  Thought Process:  Coherent and Descriptions of Associations: Intact  Orientation:  Full (Time, Place, and Person)  Thought Content:  Logical  Suicidal Thoughts:  No  Homicidal Thoughts:  No  Memory:  Immediate;   Fair Recent;   Fair Remote;   Fair  Judgement:  Intact  Insight:  Fair  Psychomotor Activity:  Normal  Concentration:  Fair  Recall:  [employer] of Knowledge:Good  Language: Good  Akathisia:  Negative  Handed:  Right  AIMS (if indicated):     Assets:  Desire for Improvement Resilience  Sleep:  Number of Hours: 4.5  Cognition: WNL  ADL's:  Intact   Mental Status Per Nursing Assessment::   On Admission:  Suicidal ideation indicated by patient  Demographic Factors:  Male and NA  Loss Factors: NA  Historical Factors: Impulsivity  Risk Reduction Factors:   Positive coping skills or problem solving skills  Continued Clinical Symptoms:  Depression:   Impulsivity Alcohol/Substance Abuse/Dependencies  Cognitive Features That Contribute To Risk:  None    Suicide Risk:  Minimal: No identifiable suicidal  ideation.  Patients presenting with no risk factors but with morbid ruminations; may be classified as minimal risk based on the severity of the depressive symptoms  Follow-up Information    [facility] Follow up.   Why:  Your appointment will be held over the phone ... Contact information: [address] [place], [place] [address] Phone: [phone] Fax: [phone]          Plan Of Care/Follow-up recommendations:  Activity:  ad lib  Antonieta Pert, MD 06/24/2018, 7:52 AM

## 2018-06-24 NOTE — [facility] Suicide Risk Assessment (Signed)
[facility] Discharge Suicide Risk Assessment   Principal Problem: Severe recurrent major depression without psychotic features [facility]) Discharge Diagnoses: Principal Problem:   Severe recurrent major depression without psychotic features (HCC)   Total Time spent with patient: 45 minutes Currently alert and oriented without suicidal thoughts plans or intent contracting fully  Mental Status Per Nursing Assessment::   On Admission:  Suicidal ideation indicated by patient  Demographic Factors:  Male  Loss Factors: NA  Historical Factors: NA  Risk Reduction Factors:   Sense of responsibility to family and Religious beliefs about death  Continued Clinical Symptoms:  Depression:   Severe  Cognitive Features That Contribute To Risk:  None    Suicide Risk:  Minimal: No identifiable suicidal ideation.  Patients presenting with no risk factors but with morbid ruminations; may be classified as minimal risk based on the severity of the depressive symptoms  Follow-up Information    [facility] Follow up on 06/27/2018.   Why:  Hospital follow up appointment is Friday, 5/8 at 12:00p.  Please bring your current medications and discharge paperwork from this hospitalization.  Contact information: [address] [place] [address] [phone] Fx: [phone]       [facility] Follow up on 07/03/2018.   Why:  Follow up appointment for shoulder trouble is Thursday, 5/14 at 8:50a.  Please expect a call from the office prior to your appointment.  Contact information: [address] [place] [address] [phone]          Plan Of Care/Follow-up recommendations:  Activity:  full  Nathan Randa, MD 06/24/2018, 9:40 AM

## 2018-06-24 NOTE — Discharge Summary (Signed)
Physician Discharge Summary Note  Patient:  [name] is an 48 y.o., male MRN:  [id number] DOB:  May 28, 1970 Patient phone:  [phone] (home)  Patient address:   [address] [place] [address],  Total Time spent with patient: 15 minutes  Date of Admission:  06/22/2018 Date of Discharge: 06/24/18  Reason for Admission:  Suicidal ideation  Principal Problem: Severe recurrent major depression without psychotic features [facility]) Discharge Diagnoses: Principal Problem:   Severe recurrent major depression without psychotic features [facility])   Past Psychiatric History: Per admission H&P: no prior psychiatric admissions, denies suicidal attempts or history of self injurious behaviors, denies history of violence. Reports history of depression, denies history of mania or hypomania, reports prior history of brief auditory hallucinations, but not recently. Describes history of PTSD symptoms, as above .   Past Medical History:  Past Medical History:  Diagnosis Date  . Hypertension     Past Surgical History:  Procedure Laterality Date  . CHOLECYSTECTOMY    . GASTRIC BYPASS     Family History:  Family History  Problem Relation Age of Onset  . Leukemia Mother    Family Psychiatric  History: Per admission H&P: mother had history of depression, had history of alcohol use disorder, father history of substance abuse, no suicides in family Social History:  Social History   Substance and Sexual Activity  Alcohol Use Yes   Comment: 3 pints liquor daily     Social History   Substance and Sexual Activity  Drug Use Not Currently    Social History   Socioeconomic History  . Marital status: Single    Spouse name: Not on file  . Number of children: Not on file  . Years of education: Not on file  . Highest education level: Not on file  Occupational History  . Not on file  Social Needs  . Financial resource strain: Not on file  . Food insecurity:    Worry: Not on file     Inability: Not on file  . Transportation needs:    Medical: Not on file    Non-medical: Not on file  Tobacco Use  . Smoking status: Current Some Day Smoker    Types: Cigars  . Smokeless tobacco: Never Used  . Tobacco comment: black and mild cigars  Substance and Sexual Activity  . Alcohol use: Yes    Comment: 3 pints liquor daily  . Drug use: Not Currently  . Sexual activity: Not on file  Lifestyle  . Physical activity:    Days per week: Not on file    Minutes per session: Not on file  . Stress: Not on file  Relationships  . Social connections:    Talks on phone: Not on file    Gets together: Not on file    Attends religious service: Not on file    Active member of club or organization: Not on file    Attends meetings of clubs or organizations: Not on file    Relationship status: Not on file  Other Topics Concern  . Not on file  Social History Narrative  . Not on file    Hospital Course:  From admission H&P: 48 year male, presented to ED with EMS and [employer]. States he had an argument with his GF, got out of car in order to walk home, stopped to rest , and " police arrived, I did not call them"As per chart , patient was initially irritable , combative, uncooperative .  Admission BAL 263. UDS negative. Patient states he has been " a little depressed" recently which he attributes to being unemployed at this time ( was working as Museum/gallery exhibitions officer but states needs his [place] license transferred to [facility] before he can get a job) and to relationship stress partly due to ED. Characterizes depression as "mild", however, and denies suicidal plans /intentions . Does endorse some neuro-vegetative symptoms as below, and describes intermittent passive suicidal thoughts, like " sometimes thinking it would be better not to be here".  Has been drinking up to 2 pints of liquor per day, up to day of admission.  [name] was admitted for alcohol abuse with suicidal ideation. CIWA protocol was started with Ativan PRN  CIWA>10. Prozac, Minipress, Vistaril, and trazodone were continued. He remained on the [facility] unit for 2 days. He stabilized with medication and therapy. He was discharged on the medications listed below. He has shown stable mood, affect, sleep, appetite, and interaction. He denies any SI/HI/AVH and contracts for safety. He agrees to follow up at [facility] (see below). He is provided with prescriptions for medications upon discharge. His girlfriend is picking him up for discharge home.  Physical Findings: AIMS: Facial and Oral Movements Muscles of Facial Expression: None, normal Lips and Perioral Area: None, normal Jaw: None, normal Tongue: None, normal,Extremity Movements Upper (arms, wrists, hands, fingers): None, normal Lower (legs, knees, ankles, toes): None, normal, Trunk Movements Neck, shoulders, hips: None, normal, Overall Severity Severity of abnormal movements (highest score from questions above): None, normal Incapacitation due to abnormal movements: None, normal Patient's awareness of abnormal movements (rate only patient's report): No Awareness, Dental Status Current problems with teeth and/or dentures?: No Does patient usually wear dentures?: No  CIWA:  CIWA-Ar Total: 1 COWS:  COWS Total Score: 13  Musculoskeletal: Strength & Muscle Tone: within normal limits Gait & Station: normal Patient leans: N/A  Psychiatric Specialty Exam: Physical Exam  Nursing note and vitals reviewed. Constitutional: He is oriented to person, place, and time. He appears well-developed and well-nourished.  Cardiovascular: Normal rate.  Respiratory: Effort normal.  Neurological: He is alert and oriented to person, place, and time.    Review of Systems  Constitutional: Negative.   Psychiatric/Behavioral: Positive for substance abuse (ETOH). Negative for depression (improving), hallucinations and suicidal ideas. The patient is not nervous/anxious and does not have insomnia.     Blood pressure (!)  134/103, pulse (!) 102, temperature 98.9 F (37.2 C), temperature source Oral, resp. rate 16, height 5\' 10"  (1.778 m), weight 68 kg, SpO2 98 %.Body mass index is 21.52 kg/m.  General Appearance: Casual  Eye Contact:  Good  Speech:  Clear and Coherent and Normal Rate  Volume:  Normal  Mood:  Euthymic  Affect:  Appropriate and Congruent  Thought Process:  Coherent  Orientation:  Full (Time, Place, and Person)  Thought Content:  WDL  Suicidal Thoughts:  No  Homicidal Thoughts:  No  Memory:  Immediate;   Good Recent;   Good  Judgement:  Intact  Insight:  Fair  Psychomotor Activity:  Normal  Concentration:  Concentration: Good and Attention Span: Good  Recall:  Good  Fund of Knowledge:  Fair  Language:  Good  Akathisia:  No  Handed:  Right  AIMS (if indicated):     Assets:  Communication Skills Desire for Improvement Housing Social Support  ADL's:  Intact  Cognition:  WNL  Sleep:  Number of Hours: 4.5     Have you used any form of  tobacco in the last 30 days? (Cigarettes, Smokeless Tobacco, Cigars, and/or Pipes): Patient Refused Screening  Has this patient used any form of tobacco in the last 30 days? (Cigarettes, Smokeless Tobacco, Cigars, and/or Pipes)  No  Blood Alcohol level:  Lab Results  Component Value Date   ETH 263 (H) 06/21/2018   ETH 341 (HH) 04/16/2018    Metabolic Disorder Labs:  Lab Results  Component Value Date   HGBA1C 5.0 06/23/2018   MPG 96.8 06/23/2018   No results found for: PROLACTIN Lab Results  Component Value Date   CHOL 195 06/23/2018   TRIG 47 06/23/2018   HDL 69 06/23/2018   CHOLHDL 2.8 06/23/2018   VLDL 9 06/23/2018   LDLCALC 117 (H) 06/23/2018    See Psychiatric Specialty Exam and Suicide Risk Assessment completed by Attending Physician prior to discharge.  Discharge destination:  Home  Is patient on multiple antipsychotic therapies at discharge:  No   Has Patient had three or more failed trials of antipsychotic monotherapy  by history:  No  Recommended Plan for Multiple Antipsychotic Therapies: NA  Discharge Instructions    Discharge instructions   Complete by:  As directed    Patient is instructed to take all prescribed medications as recommended. Report any side effects or adverse reactions to your outpatient psychiatrist. Patient is instructed to abstain from alcohol and illegal drugs while on prescription medications. In the event of worsening symptoms, patient is instructed to call the crisis hotline, 911, or go to the nearest emergency department for evaluation and treatment.     Allergies as of 06/24/2018      Reactions   Oxycodone Anaphylaxis   Pollen Extract Shortness Of Breath, Itching, Other (See Comments)   Runny nose, itchy eyes, sniffles      Medication List    STOP taking these medications   docusate sodium 100 MG capsule Commonly known as:  COLACE   hydrocortisone 25 MG suppository Commonly known as:  ANUSOL-HC   ibuprofen 200 MG tablet Commonly known as:  ADVIL   omeprazole 20 MG tablet Commonly known as:  PriLOSEC OTC     TAKE these medications     Indication  amLODipine 10 MG tablet Commonly known as:  NORVASC Take 10 mg by mouth daily.  Indication:  High Blood Pressure Disorder   famotidine 10 MG tablet Commonly known as:  PEPCID Take 10 mg by mouth 2 (two) times daily as needed for heartburn.  Indication:  Heartburn   FLUoxetine 40 MG capsule Commonly known as:  PROZAC Take 40 mg by mouth daily.  Indication:  Depression   folic acid 1 MG tablet Commonly known as:  FOLVITE Take 1 tablet (1 mg total) by mouth daily.  Indication:  Supplementation   hydrOXYzine 25 MG capsule Commonly known as:  VISTARIL Take 25 mg by mouth every 8 (eight) hours as needed for anxiety.  Indication:  Feeling Anxious   multivitamin with minerals Tabs tablet Take 1 tablet by mouth daily. Start taking on:  Jun 25, 2018  Indication:  Supplementation   prazosin 1 MG  capsule Commonly known as:  MINIPRESS Take 1 mg by mouth at bedtime.  Indication:  Frightening Dreams   thiamine 100 MG tablet Take 1 tablet (100 mg total) by mouth daily.  Indication:  Supplementation   traZODone 50 MG tablet Commonly known as:  DESYREL Take 50 mg by mouth at bedtime.  Indication:  Trouble Sleeping      Follow-up Information  [facility] Follow up on 06/27/2018.   Why:  Hospital follow up appointment is Friday, 5/8 at 12:00p.  Please bring your current medications and discharge paperwork from this hospitalization.  Contact information: [address] [place] [address] Ph:([phone] Fx: [phone]        [facility] Follow up on 07/03/2018.   Why:  Follow up appointment for shoulder trouble is Thursday, 5/14 at 8:50a.  Please expect a call from the office prior to your appointment.  Contact information: [address] [place] [address] [phone]          Follow-up recommendations: Activity as tolerated. Diet as recommended by primary care physician. Keep all scheduled follow-up appointments as recommended.   Comments:   Patient is instructed to take all prescribed medications as recommended. Report any side effects or adverse reactions to your outpatient psychiatrist. Patient is instructed to abstain from alcohol and illegal drugs while on prescription medications. In the event of worsening symptoms, patient is instructed to call the crisis hotline, 911, or go to the nearest emergency department for evaluation and treatment.  Signed: Aldean Baker, NP 06/24/2018, 4:31 PM

## 2018-07-03 ENCOUNTER — Inpatient Hospital Stay: Payer: Medicaid Other

## 2018-09-01 ENCOUNTER — Other Ambulatory Visit: Payer: Self-pay | Admitting: *Deleted

## 2018-09-01 DIAGNOSIS — Z20822 Contact with and (suspected) exposure to covid-19: Secondary | ICD-10-CM

## 2018-09-05 LAB — NOVEL CORONAVIRUS, NAA: SARS-CoV-2, NAA: NOT DETECTED

## 2018-10-29 ENCOUNTER — Ambulatory Visit (HOSPITAL_COMMUNITY)
Admission: EM | Admit: 2018-10-29 | Discharge: 2018-10-29 | Disposition: A | Discharge status: Home / Self Care | Payer: BLUE CROSS/BLUE SHIELD | Attending: Psychiatry | Admitting: Psychiatry

## 2018-10-29 DIAGNOSIS — F1014 Alcohol abuse with alcohol-induced mood disorder: Secondary | ICD-10-CM | POA: Insufficient documentation

## 2018-10-29 DIAGNOSIS — Z885 Allergy status to narcotic agent status: Secondary | ICD-10-CM | POA: Diagnosis not present

## 2018-10-29 DIAGNOSIS — F17290 Nicotine dependence, other tobacco product, uncomplicated: Secondary | ICD-10-CM | POA: Insufficient documentation

## 2018-10-29 NOTE — H&P (Signed)
Behavioral Health Medical Screening Exam  [name] is an 48 y.o. male who presents to [facility] as a walk-in, voluntarily although transported by police. Patient is very anxious on assessment. His mood is labile. He endorses guilt and depression secondary to ETOH abuse. He reports drinking between 4-6 pints of Vodka although denies drinking daily. Reports drinking at least 2 pints yesterday. Reports today, while sitting on the curb, he was approached by the police and asked if he was ok. States, " everything just went downhill from there and I told him I was just tired of drinking." Reports no prior substance abuse treatment besides AA. States that he feel a few weeks ago, injuring his arm which required surgery. Reports he has been drinking alcohol more to cope with his pain. Reports he is currently followed by Dr. Erling Cruz is is managing his medications for anxiety, depression, and PTSD. Reports no current therapist. He denies SI, HI or AVH as well as other psychosis. He was discharged from [facility] 06/2018 per review of chart with similar presentation in regard to alcohol abuse. Per chart review, he has presented to the ED's multiple times for alcohol intoxication or withdrawal.   Total Time spent with patient: 20 minutes  Psychiatric Specialty Exam: Physical Exam  Vitals reviewed. Constitutional: He is oriented to person, place, and time.  Neurological: He is alert and oriented to person, place, and time.    Review of Systems  Psychiatric/Behavioral: Positive for depression and substance abuse. Negative for hallucinations, memory loss and suicidal ideas. The patient is nervous/anxious. The patient does not have insomnia.   All other systems reviewed and are negative.   Blood pressure 130/78, pulse (!) 104, temperature 99.6 F (37.6 C), temperature source Oral, resp. rate 16, SpO2 100 %.There is no height or weight on file to calculate BMI.  General Appearance: Fairly Groomed  Eye Contact:  Fair   Speech:  Clear and Coherent and Normal Rate  Volume:  Decreased  Mood:  Anxious and Depressed  Affect:  Labile, Tearful and anxious  Thought Process:  Coherent, Linear and Descriptions of Associations: Intact  Orientation:  Full (Time, Place, and Person)  Thought Content:  WDL  Suicidal Thoughts:  No  Homicidal Thoughts:  No  Memory:  Immediate;   Fair Recent;   Fair  Judgement:  Fair  Insight:  Fair  Psychomotor Activity:  Normal  Concentration: Concentration: Fair and Attention Span: Fair  Recall:  [employer] of Knowledge:Fair  Language: Good  Akathisia:  Negative  Handed:  Right  AIMS (if indicated):     Assets:  Communication Skills Desire for Improvement Resilience  Sleep:       Musculoskeletal: Strength & Muscle Tone: within normal limits Gait & Station: normal Patient leans: N/A  Blood pressure 130/78, pulse (!) 104, temperature 99.6 F (37.6 C), temperature source Oral, resp. rate 16, SpO2 100 %.  Recommendations:  Based on my evaluation the patient does not appear to have an emergency medical condition.  No evidence of imminent risk to self or others at present.   Patient does not meet criteria for psychiatric inpatient admission. Counselor provided resources for his alcohol abuse both outpatient and inpatient. He was strongly encouraged  To follow-up with resources provided.      Mordecai Maes, NP 10/29/2018, 3:59 PM

## 2018-10-29 NOTE — BH Assessment (Addendum)
Assessment Note  [name] is a 48 y.o. male who presents voluntarily via police to [facility] for a walk-in assessment. Police were called by pt's long-time girlfriend, [name]. Pt gave verbal permission to speak with [name]. By phone [name] states pt was pulling apart the home & her belongings looking for money when she refused to give him a ride. When she noticed he also dumped her purse and stole her wallet, she called the police. Pt has a history of alcohol abuse. Pt reports he is on a lot of medication, including gabapentin. Pt states he took pain medication after shoulder surgery for about 1 1/2 weeks and has none left. Pt denies current suicidal ideation. He denies past suicide attempts. Pt acknowledges multiple symptoms of Depression, including anhedonia, isolating, feelings of worthlessness & guilt, tearfulness,  & increased irritability. Pt denies homicidal ideation/ history of violence. Pt denies auditory & visual hallucinations & other symptoms of psychosis.    Pt lives with his girlfriend, [name]. Pt denies hx of abuse and trauma. Pt reports there is a family history of substance abuse. Pt has fair insight and judgment. Pt's memory is in tact.   Protective factors against suicide include family support, no current suicidal ideation, future orientation, no access to firearms, no past attempts & no current psychotic symptoms.?  Pt's OP history includes Dr. Erling Barnes. IP history includes [facility].  Pt reports alcohol/ substance abuse, drinking 2-5 pints of vodka about twice weekly.  Girlfriend, [name] declined to pick pt up at [facility]. She provided pt's brother's phone number [name], [phone]), which was given to pt.  ? MSE: Pt is casually dressed, alert, oriented x4 with normal speech and unsteady motor behavior. Eye contact is fair. Pt's mood is depressed and affect is sad. Affect is congruent with mood. Thought process is coherent and relevant. There is no indication pt is  currently responding to internal stimuli or experiencing delusional thought content. Pt was cooperative throughout assessment.    Diagnosis: Alcohol abuse with alcohol-induced mood disorder Disposition: Nathan Maes, NP, recommends follow up with substance abuse tx. Resources provided to pt.  Past Medical History:  Past Medical History:  Diagnosis Date  . Hypertension     Past Surgical History:  Procedure Laterality Date  . CHOLECYSTECTOMY    . GASTRIC BYPASS      Family History:  Family History  Problem Relation Age of Onset  . Leukemia Mother     Social History:  reports that he has been smoking cigars. He has never used smokeless tobacco. He reports current alcohol use. He reports previous drug use.  Additional Social History:  Alcohol / Drug Use Pain Medications: Pt states he stopped taking pain meds few days ago. Had shoulder surgery 2 weeks ago Prescriptions: Gabapentin "& others" per pt Over the Counter: not reported History of alcohol / drug use?: Yes Substance #1 Name of Substance 1: alcohol/vodka 1 - Amount (size/oz): 2-4 pints 1 - Frequency: twice weekly 1 - Last Use / Amount: 10/28/2018  CIWA: CIWA-Ar BP: 130/78 Pulse Rate: (!) 104 COWS:    Allergies:  Allergies  Allergen Reactions  . Oxycodone Anaphylaxis  . Pollen Extract Shortness Of Breath, Itching and Other (See Comments)    Runny nose, itchy eyes, sniffles    Home Medications: (Not in a hospital admission)   OB/GYN Status:  No LMP for male patient.  General Assessment Data Location of Assessment: [facility] Assessment Services TTS Assessment: In system Is this a Tele or Face-to-Face  Assessment?: Face-to-Face Is this an Initial Assessment or a Re-assessment for this encounter?: Initial Assessment Patient Accompanied by:: Other(police) Language Other than English: No Living Arrangements: Other (Comment) What gender do you identify as?: Male Marital status: Long term relationship Living  Arrangements: Spouse/significant other, Children Can pt return to current living arrangement?: (pt states he doesn't know) Admission Status: Voluntary Is patient capable of signing voluntary admission?: Yes Referral Source: Other(police) Insurance type: BCBS     Crisis Care Plan Living Arrangements: Spouse/significant other, Children Name of Psychiatrist: Dr. Sandria Barnes Name of Therapist: none  Education Status Is patient currently in school?: No  Risk to self with the past 6 months Suicidal Ideation: No Has patient been a risk to self within the past 6 months prior to admission? : No Suicidal Intent: No Has patient had any suicidal intent within the past 6 months prior to admission? : No Is patient at risk for suicide?: Yes Suicidal Plan?: No Has patient had any suicidal plan within the past 6 months prior to admission? : No Access to Means: No What has been your use of drugs/alcohol within the last 12 months?: alcohol twice weekly Previous Attempts/Gestures: Yes How many times?: 2 Other Self Harm Risks: male, recent MH tx Triggers for Past Attempts: Unknown Intentional Self Injurious Behavior: None Family Suicide History: No Recent stressful life event(s): Loss (Comment)(job loss) Persecutory voices/beliefs?: No Depression: Yes Depression Symptoms: Tearfulness, Insomnia, Despondent, Fatigue, Guilt, Loss of interest in usual pleasures, Feeling worthless/self pity, Feeling angry/irritable Substance abuse history and/or treatment for substance abuse?: Yes Suicide prevention information given to non-admitted patients: Not applicable  Risk to Others within the past 6 months Homicidal Ideation: No Does patient have any lifetime risk of violence toward others beyond the six months prior to admission? : No Thoughts of Harm to Others: No Current Homicidal Intent: No Current Homicidal Plan: No Access to Homicidal Means: No History of harm to others?: No Assessment of Violence: None  Noted Does patient have access to weapons?: No  Psychosis Hallucinations: None noted(none at present; pt states did have about 3 hours ago) Delusions: None noted  Mental Status Report Appearance/Hygiene: Disheveled Eye Contact: Fair Motor Activity: Unsteady Speech: Logical/coherent Level of Consciousness: Drowsy Mood: Depressed Affect: Sad Anxiety Level: Minimal Thought Processes: Coherent Judgement: Partial Orientation: Appropriate for developmental age Obsessive Compulsive Thoughts/Behaviors: None  Cognitive Functioning Concentration: Fair Memory: Recent Intact, Remote Intact Is patient IDD: No Insight: Fair Impulse Control: Fair Appetite: Poor Have you had any weight changes? : Loss Amount of the weight change? (lbs): (UTA) Sleep: Decreased Total Hours of Sleep: (UTA)  ADLScreening [facility] Assessment Services) Patient's cognitive ability adequate to safely complete daily activities?: Yes Patient able to express need for assistance with ADLs?: Yes Independently performs ADLs?: Yes (appropriate for developmental age)  Prior Inpatient Therapy Prior Inpatient Therapy: Yes Prior Therapy Dates: 06/2018 Prior Therapy Facilty/Provider(s): [facility] Reason for Treatment: etoh, depression  Prior Outpatient Therapy Prior Outpatient Therapy: No Does patient have an ACCT team?: No Does patient have Intensive In-House Services?  : No Does patient have [facility] services? : No Does patient have P4CC services?: No  ADL Screening (condition at time of admission) Patient's cognitive ability adequate to safely complete daily activities?: Yes Is the patient deaf or have difficulty hearing?: No Does the patient have difficulty seeing, even when wearing glasses/contacts?: No Does the patient have difficulty concentrating, remembering, or making decisions?: No Patient able to express need for assistance with ADLs?: Yes Does the patient have difficulty dressing  or bathing?: No Independently  performs ADLs?: Yes (appropriate for developmental age) Does the patient have difficulty walking or climbing stairs?: No Weakness of Legs: Left  Home Assistive Devices/Equipment Home Assistive Devices/Equipment: Sling (specify type)  Therapy Consults (therapy consults require a physician order) PT Evaluation Needed: No OT Evalulation Needed: No SLP Evaluation Needed: No Abuse/Neglect Assessment (Assessment to be complete while patient is alone) Abuse/Neglect Assessment Can Be Completed: Unable to assess, patient is non-responsive or altered mental status Values / Beliefs Cultural Requests During Hospitalization: None Spiritual Requests During Hospitalization: None Consults Spiritual Care Consult Needed: No Social Work Consult Needed: No Merchant navy officerAdvance Directives (For Healthcare) Does Patient Have a Medical Advance Directive?: No Would patient like information on creating a medical advance directive?: No - Patient declined          Disposition: Denzil MagnusonLaShunda Thomas, NP, recommends follow up with substance abuse tx. Resources provided to pt.  Disposition Initial Assessment Completed for this Encounter: Yes Disposition of Patient: Discharge  On Site Evaluation by:   Reviewed with Physician:    Clearnce Sorreleirdre H Kera Deacon 10/29/2018 4:26 PM

## 2018-12-03 ENCOUNTER — Emergency Department (HOSPITAL_COMMUNITY)
Admission: EM | Admit: 2018-12-03 | Discharge: 2018-12-04 | Disposition: A | Discharge status: Home / Self Care | Payer: BLUE CROSS/BLUE SHIELD | Attending: Emergency Medicine | Admitting: Emergency Medicine

## 2018-12-03 ENCOUNTER — Other Ambulatory Visit: Payer: Self-pay

## 2018-12-03 ENCOUNTER — Emergency Department (HOSPITAL_COMMUNITY): Payer: BLUE CROSS/BLUE SHIELD

## 2018-12-03 ENCOUNTER — Encounter (HOSPITAL_COMMUNITY): Payer: Self-pay

## 2018-12-03 DIAGNOSIS — R55 Syncope and collapse: Secondary | ICD-10-CM | POA: Diagnosis not present

## 2018-12-03 DIAGNOSIS — F10229 Alcohol dependence with intoxication, unspecified: Secondary | ICD-10-CM | POA: Diagnosis not present

## 2018-12-03 DIAGNOSIS — F10920 Alcohol use, unspecified with intoxication, uncomplicated: Secondary | ICD-10-CM

## 2018-12-03 DIAGNOSIS — F322 Major depressive disorder, single episode, severe without psychotic features: Secondary | ICD-10-CM | POA: Diagnosis not present

## 2018-12-03 DIAGNOSIS — F17210 Nicotine dependence, cigarettes, uncomplicated: Secondary | ICD-10-CM | POA: Insufficient documentation

## 2018-12-03 DIAGNOSIS — Z885 Allergy status to narcotic agent status: Secondary | ICD-10-CM | POA: Diagnosis not present

## 2018-12-03 DIAGNOSIS — Y908 Blood alcohol level of 240 mg/100 ml or more: Secondary | ICD-10-CM | POA: Diagnosis not present

## 2018-12-03 DIAGNOSIS — I10 Essential (primary) hypertension: Secondary | ICD-10-CM | POA: Insufficient documentation

## 2018-12-03 DIAGNOSIS — F10929 Alcohol use, unspecified with intoxication, unspecified: Secondary | ICD-10-CM | POA: Diagnosis present

## 2018-12-03 DIAGNOSIS — Z79899 Other long term (current) drug therapy: Secondary | ICD-10-CM | POA: Insufficient documentation

## 2018-12-03 DIAGNOSIS — Z046 Encounter for general psychiatric examination, requested by authority: Secondary | ICD-10-CM

## 2018-12-03 LAB — URINALYSIS, ROUTINE W REFLEX MICROSCOPIC
Bilirubin Urine: NEGATIVE
Glucose, UA: NEGATIVE mg/dL
Hgb urine dipstick: NEGATIVE
Ketones, ur: NEGATIVE mg/dL
Leukocytes,Ua: NEGATIVE
Nitrite: NEGATIVE
Protein, ur: NEGATIVE mg/dL
Specific Gravity, Urine: 1.002 — ABNORMAL LOW (ref 1.005–1.030)
pH: 5 (ref 5.0–8.0)

## 2018-12-03 LAB — COMPREHENSIVE METABOLIC PANEL
ALT: 82 U/L — ABNORMAL HIGH (ref 0–44)
AST: 160 U/L — ABNORMAL HIGH (ref 15–41)
Albumin: 4.6 g/dL (ref 3.5–5.0)
Alkaline Phosphatase: 58 U/L (ref 38–126)
Anion gap: 13 (ref 5–15)
BUN: 8 mg/dL (ref 6–20)
CO2: 23 mmol/L (ref 22–32)
Calcium: 8.5 mg/dL — ABNORMAL LOW (ref 8.9–10.3)
Chloride: 106 mmol/L (ref 98–111)
Creatinine, Ser: 0.77 mg/dL (ref 0.61–1.24)
GFR calc Af Amer: >60 mL/min (ref >60)
GFR calc non Af Amer: >60 mL/min (ref >60)
Glucose, Bld: 97 mg/dL (ref 70–99)
Potassium: 3.4 mmol/L — ABNORMAL LOW (ref 3.5–5.1)
Sodium: 142 mmol/L (ref 135–145)
Total Bilirubin: 1.5 mg/dL — ABNORMAL HIGH (ref 0.3–1.2)
Total Protein: 8 g/dL (ref 6.5–8.1)

## 2018-12-03 LAB — ETHANOL: Alcohol, Ethyl (B): 375 mg/dL (ref <10)

## 2018-12-03 LAB — RAPID URINE DRUG SCREEN, HOSP PERFORMED
Amphetamines: NOT DETECTED
Barbiturates: NOT DETECTED
Benzodiazepines: NOT DETECTED
Cocaine: NOT DETECTED
Opiates: NOT DETECTED
Tetrahydrocannabinol: NOT DETECTED

## 2018-12-03 LAB — CBC WITH DIFFERENTIAL/PLATELET
Abs Immature Granulocytes: 0.01 10*3/uL (ref 0.00–0.07)
Basophils Absolute: 0 10*3/uL (ref 0.0–0.1)
Basophils Relative: 1 %
Eosinophils Absolute: 0 10*3/uL (ref 0.0–0.5)
Eosinophils Relative: 0 %
HCT: 41.7 % (ref 39.0–52.0)
Hemoglobin: 13.2 g/dL (ref 13.0–17.0)
Immature Granulocytes: 0 %
Lymphocytes Relative: 36 %
Lymphs Abs: 1.2 10*3/uL (ref 0.7–4.0)
MCH: 27.6 pg (ref 26.0–34.0)
MCHC: 31.7 g/dL (ref 30.0–36.0)
MCV: 87.1 fL (ref 80.0–100.0)
Monocytes Absolute: 0.2 10*3/uL (ref 0.1–1.0)
Monocytes Relative: 5 %
Neutro Abs: 1.9 10*3/uL (ref 1.7–7.7)
Neutrophils Relative %: 58 %
Platelets: 216 10*3/uL (ref 150–400)
RBC: 4.79 MIL/uL (ref 4.22–5.81)
RDW: 15.9 % — ABNORMAL HIGH (ref 11.5–15.5)
WBC: 3.3 10*3/uL — ABNORMAL LOW (ref 4.0–10.5)
nRBC: 0 % (ref 0.0–0.2)

## 2018-12-03 LAB — CBG MONITORING, ED: Glucose-Capillary: 98 mg/dL (ref 70–99)

## 2018-12-03 MED ORDER — SODIUM CHLORIDE 0.9 % IV BOLUS
1000.0000 mL | Freq: Once | INTRAVENOUS | Status: AC
  Administered 2018-12-03: 1000 mL via INTRAVENOUS

## 2018-12-03 MED ORDER — ZIPRASIDONE MESYLATE 20 MG IM SOLR
20.0000 mg | Freq: Once | INTRAMUSCULAR | Status: AC
  Administered 2018-12-03: 20 mg via INTRAMUSCULAR
  Filled 2018-12-03: qty 20

## 2018-12-03 MED ORDER — STERILE WATER FOR INJECTION IJ SOLN
INTRAMUSCULAR | Status: AC
  Filled 2018-12-03: qty 10

## 2018-12-03 NOTE — ED Notes (Signed)
Upon pt arrival, pt was told to please put on mask. Pt ignored this Therapist, sports. This RN then went up to pt and informed him she would assist with mask. When RN went to put mask on pt, pt grabbed this RN's arm and would not release. This RN did a strong yank and got her arm back without injury. Security called.
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
## 2018-12-03 NOTE — ED Triage Notes (Signed)
EMS reports from home, called out for possible syncopal episode. ETOH abuse/ingestion. Wife states Pt has been drinking all day. Hx of Schizophrenia, depression and anxiety. Pt responsive to painful stimuli, and uncooperative at will, Pt would not allow EMS to start IV and combative at registration.  BP 141/102 HR 83 RR 16 Sp02 97 RA CBG 160

## 2018-12-03 NOTE — ED Notes (Signed)
Patient asleep. This RN noticed that his IV has been pulled out and was on the ground.
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
## 2018-12-03 NOTE — ED Notes (Addendum)
Date and time results received: 12/03/18 2136 (use smartphrase ".now" to insert current time)  Test: etoh Critical Value: 375  Name of Provider Notified: Regenia Skeeter MD  Orders Received? Or Actions Taken?: no orders at this time

## 2018-12-04 NOTE — ED Notes (Signed)
Patient given his belongings that were previously stored away while he was IVC. Patient states he needs help finding a ride home.

## 2018-12-04 NOTE — ED Notes (Signed)
Patient is weak but is able to slowly ambulate on his own, with no assistance.

## 2018-12-04 NOTE — Discharge Instructions (Addendum)
Thank you for allowing me to care for you today in the Emergency Department.   Please continue to follow-up with psychiatry if you have concerns about alcohol dependence.  Consider cutting back on the amount of alcohol you drink.  Return to the emergency department if you stop drinking alcohol and develop confusion, if you have a seizure, or if you have thoughts of wanting to hurt yourself, others, or start having auditory visual hallucinations.

## 2018-12-19 ENCOUNTER — Emergency Department (HOSPITAL_COMMUNITY)
Admission: EM | Admit: 2018-12-19 | Discharge: 2018-12-19 | Disposition: A | Discharge status: Home / Self Care | Payer: BLUE CROSS/BLUE SHIELD | Attending: Emergency Medicine | Admitting: Emergency Medicine

## 2018-12-19 ENCOUNTER — Other Ambulatory Visit: Payer: Self-pay

## 2018-12-19 DIAGNOSIS — F10920 Alcohol use, unspecified with intoxication, uncomplicated: Secondary | ICD-10-CM | POA: Diagnosis not present

## 2018-12-19 DIAGNOSIS — I10 Essential (primary) hypertension: Secondary | ICD-10-CM | POA: Insufficient documentation

## 2018-12-19 DIAGNOSIS — Z79899 Other long term (current) drug therapy: Secondary | ICD-10-CM | POA: Insufficient documentation

## 2018-12-19 DIAGNOSIS — Z5320 Procedure and treatment not carried out because of patient's decision for unspecified reasons: Secondary | ICD-10-CM | POA: Insufficient documentation

## 2018-12-19 DIAGNOSIS — F17290 Nicotine dependence, other tobacco product, uncomplicated: Secondary | ICD-10-CM | POA: Diagnosis not present

## 2018-12-19 DIAGNOSIS — F10229 Alcohol dependence with intoxication, unspecified: Secondary | ICD-10-CM | POA: Diagnosis present

## 2018-12-19 NOTE — ED Notes (Signed)
Dr Roslynn Amble stating that patient is not IVC candidate and patient is not wanting to stay, so asked this RN to call [name] girlfriend to come pick up patient.

## 2018-12-19 NOTE — ED Notes (Signed)
Pt escorted by triage staff back to triage room. Dr Roslynn Amble made aware.

## 2018-12-19 NOTE — ED Triage Notes (Signed)
Pt's girlfriend had to be called to get more information as patient not answering questions or allowing staff to get Vital signs.  [name] states that patient drinks large amount of vodka daily to the point of not able to talk to you. Report that he missed a court date yesterday and his attorney advised that patient go into long term detox treatment for his ETOH addiction. They were advised to go to ED. Pt had rotator cuff end of August and will have pains for it and will drink more ETOH when pain gets bad.  Denies SI or HI.

## 2018-12-19 NOTE — ED Notes (Signed)
Pt assisted out of car by EMT. Pt very intoxicated and when this Triage RN trying to question patient's, get patient's name, and vital signs, pt would not answer. Pt jerking arms and getting up and ambulating down hallways to get away from ED staff.

## 2018-12-19 NOTE — ED Notes (Signed)
Called [name]. Pulled up to ED entrance. Pt was ambulatory out to car without any assistance.

## 2018-12-19 NOTE — ED Provider Notes (Signed)
[facility] DEPT Provider Note   CSN: [id number] Arrival date & time: 12/19/18  1842     History   Chief Complaint Chief Complaint  Patient presents with  . detox  . Alcohol Intoxication    HPI [name] is a 48 y.o. male.  Presumably department with alcohol tox [place], initially with request for detox.  History limited due to alcohol intoxication, patient's agitation, and relative uncooperativeness.  Additional history is was obtained from his girlfriend [name].  Patient has a long history of alcoholism.  Girlfriend reports that when he drinks a lot, he can become very agitated, but does not exhibit suicidal thoughts or behavior.  Patient had a recent miss court date and his attorney had recommended detox or assessment in ER.  Patient initially reported to nursing staff that he was interested in getting help with detox.  At time of my assessment, patient states he did not want her help and wanted to go home.  He specifically denied any suicidal thoughts, thoughts of hurting others, auditory or visual hallucinations.     HPI  Past Medical History:  Diagnosis Date  . Hypertension     Patient Active Problem List   Diagnosis Date Noted  . 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 Alcohol dependence with withdrawal with complication ([place]) 01/74/9449  . Severe recurrent major depression without psychotic features ([place]) 06/22/2018  . Alcoholic ketoacidosis 67/59/1638  . Alcohol withdrawal ([place]) 01/14/2018  . Abnormal transaminases 01/14/2018  . Essential hypertension 01/14/2018  . Hypokalemia 01/14/2018  . Leukopenia 01/14/2018    Past Surgical History:  Procedure Laterality Date  . CHOLECYSTECTOMY    . GASTRIC BYPASS          Home Medications    Prior to Admission medications   Medication Sig Start Date End Date Taking? Authorizing Provider  amLODipine (NORVASC) 10 MG tablet  Take 10 mg by mouth daily.    [provider]  famotidine (PEPCID) 10 MG tablet Take 10 mg by mouth 2 (two) times daily as needed for heartburn.     [provider]  FLUoxetine (PROZAC) 40 MG capsule Take 40 mg by mouth daily.    [provider]  folic acid (FOLVITE) 1 MG tablet Take 1 tablet (1 mg total) by mouth daily. 01/17/18   Oswald Hillock, MD  hydrOXYzine (VISTARIL) 25 MG capsule Take 25 mg by mouth every 8 (eight) hours as needed for anxiety.     [provider]  Multiple Vitamin (MULTIVITAMIN WITH MINERALS) TABS tablet Take 1 tablet by mouth daily. 06/25/18   Connye Burkitt, NP  prazosin (MINIPRESS) 1 MG capsule Take 1 mg by mouth at bedtime. 05/12/18   [provider]  thiamine 100 MG tablet Take 1 tablet (100 mg total) by mouth daily. 01/17/18   Oswald Hillock, MD  traZODone (DESYREL) 50 MG tablet Take 50 mg by mouth at bedtime. 06/10/18   [provider]    Family History Family History  Problem Relation Age of Onset  . Leukemia Mother     Social History Social History   Tobacco Use  . Smoking status: Current Some Day Smoker    Types: Cigars  . Smokeless tobacco: Never Used  . Tobacco comment: black and mild cigars  Substance Use Topics  . Alcohol use: Yes    Comment: 3 pints liquor daily  . Drug use: Not Currently     Allergies  Oxycodone and Pollen extract   Review of Systems Review of Systems  Unable to perform ROS: Other  Patient not cooperative with completing comprehensive history  Physical Exam Updated Vital Signs There were no vitals taken for this visit.  Refused vital signs  Physical Exam Constitutional:      Comments: Intoxicated, somewhat agitated  HENT:     Head: Normocephalic and atraumatic.     Mouth/Throat:     Mouth: Mucous membranes are moist.     Pharynx: Oropharynx is clear.  Neck:     Musculoskeletal: Normal range of motion and neck supple.  Cardiovascular:     Comments:  Well-perfused Pulmonary:     Effort: Pulmonary effort is normal. No respiratory distress.  Abdominal:     General: Abdomen is flat. Bowel sounds are normal.  Musculoskeletal:        General: No swelling or deformity.  Neurological:     Comments: Intoxicated but alert, oriented x3  Psychiatric:     Comments: Was somewhat agitated but no suicidal or homicidal ideation, no auditory or visual hallucinations      ED Treatments / Results  Labs (all labs ordered are listed, but only abnormal results are displayed) Labs Reviewed - No data to display  EKG None  Radiology No results found.  Procedures Procedures (including critical care time)  Medications Ordered in ED Medications - No data to display   Initial Impression / Assessment and Plan / ED Course  I have reviewed the triage vital signs and the nursing notes.  Pertinent labs & imaging results that were available during my care of the patient were reviewed by me and considered in my medical decision making (see chart for details).        48 year old male on with known history of alcohol abuse presents to ER initially with reported interest in getting help finding detox.  When I assessed patient he was clearly intoxicated, somewhat agitated, and stated he did not want to stay in the emergency department any longer.  Patient denied any thoughts of hurting himself, hurting others.  Denied any auditory visualizations.  Discussed case in detail with his girlfriend, states this is common behavior for patient and she confirms that he has not demonstrated any suicidal or homicidal ideation today or recently.  She is able to pick him up.  I recommended to patient staying for more comprehensive assessment, further work-up and observation.  However patient insisted on going home at this time.  Do not have indication for IVC at this time.  Will discharge in care of his girlfriend.   Final Clinical Impressions(s) / ED Diagnoses   Final  diagnoses:  Alcoholic intoxication without complication [facility])    ED Discharge Orders    None       Milagros Loll, MD 12/19/18 1942

## 2020-01-05 IMAGING — DX DG CHEST 1V PORT
1 series · 1 of 1 positions shown · non-contrast
Comparison: Chest radiographs 11/20/2017.

CLINICAL DATA: 48-year-old male with possible syncopal episode.
Altered mental status.

EXAM:
PORTABLE CHEST 1 VIEW

[chest ap]
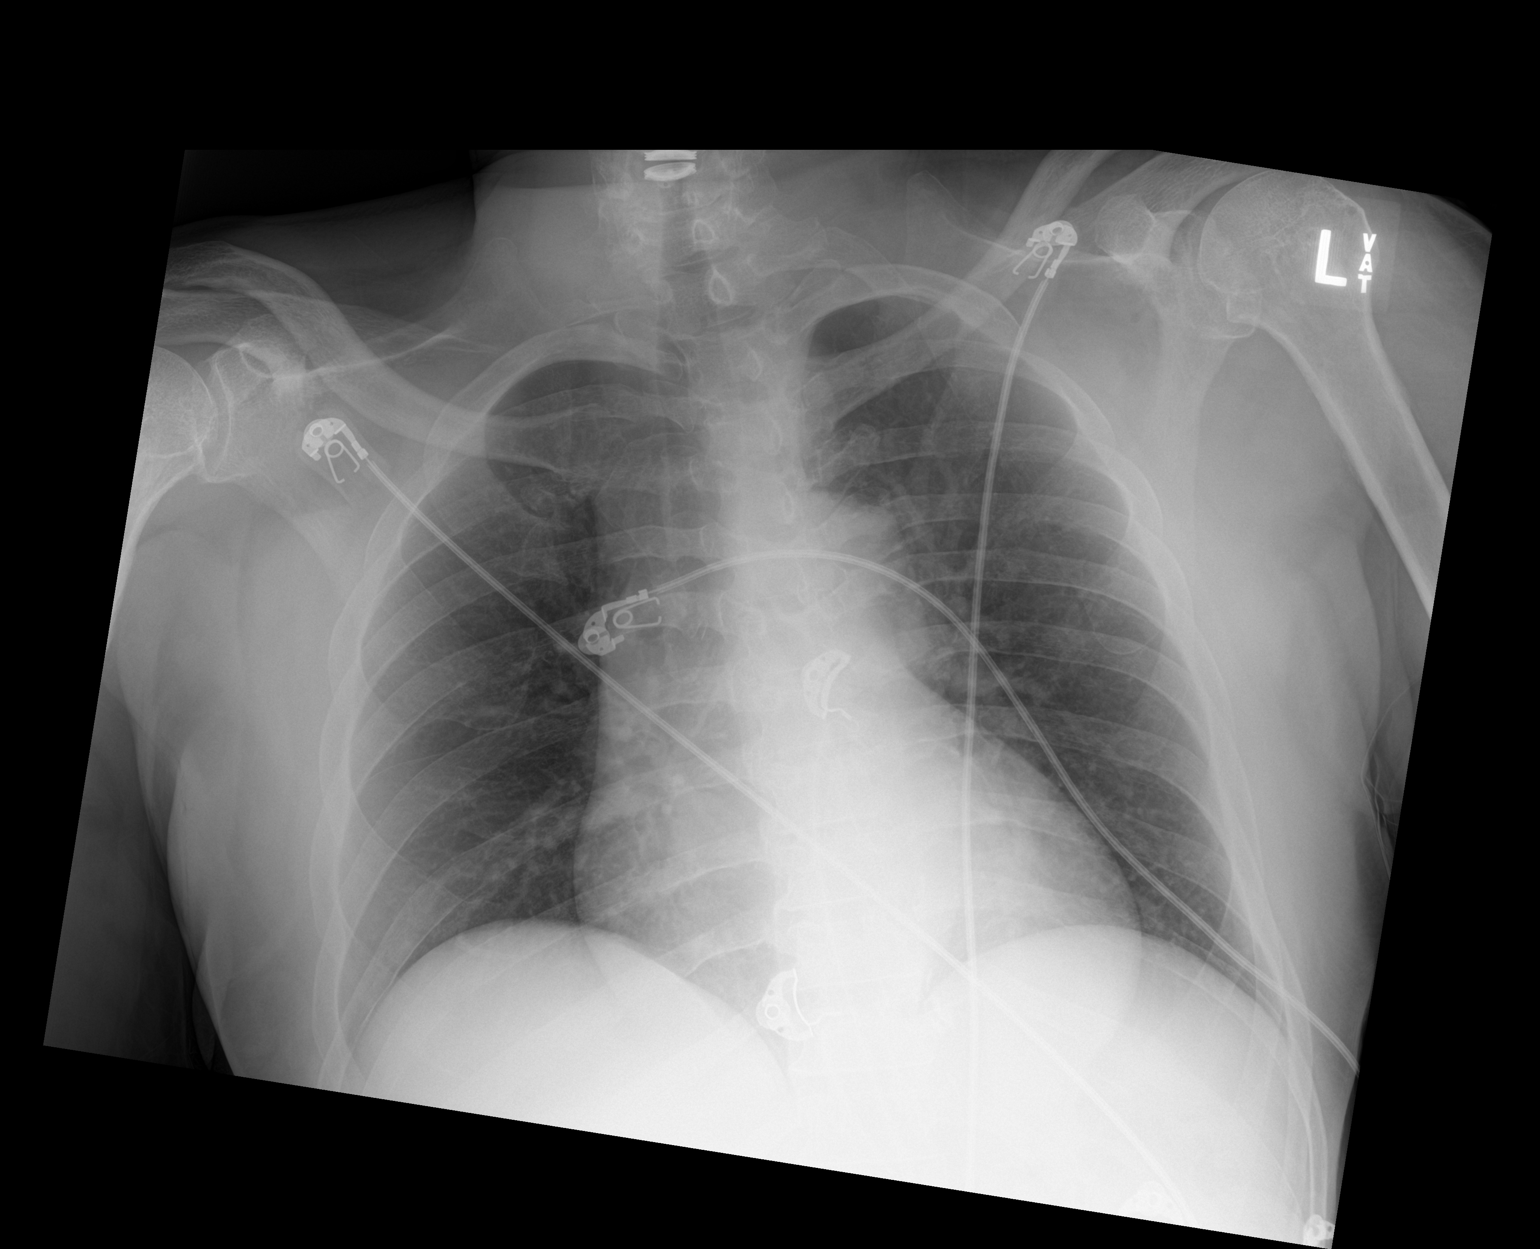

[1 of 1 positions shown; findings below may reference images not displayed]

FINDINGS: Portable AP supine view at 4090 hours. Lung volumes and mediastinal
contours are stable allowing for portable technique. Allowing for
portable technique the lungs are clear. Visualized tracheal air
column is within normal limits. Paucity of bowel gas in the upper
abdomen. Previous cervical spine disc arthroplasty. Otherwise
negative visible osseous structures.
IMPRESSION: Negative portable chest.

## 2020-01-05 IMAGING — CT CT HEAD W/O CM
3 series · 16 of 47 positions shown, 19 images · non-contrast
Comparison: None.

CLINICAL DATA: 48-year-old male with possible syncopal episode.
Altered mental status.

EXAM:
CT HEAD WITHOUT CONTRAST
TECHNIQUE: Contiguous axial images were obtained from the base of the skull
through the vertex without intravenous contrast.

[Series 3: head wo · axial · 0.43mm/px · z∈[+1348,+1488]mm · 10 of 34 slices shown, 13 images]
[im 3/34  brain]
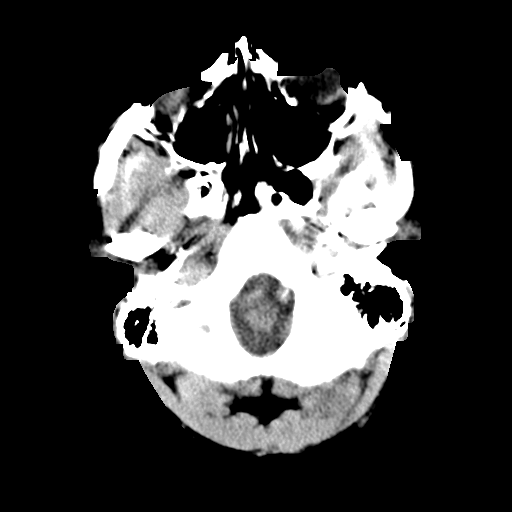
[im 3/34  bone]
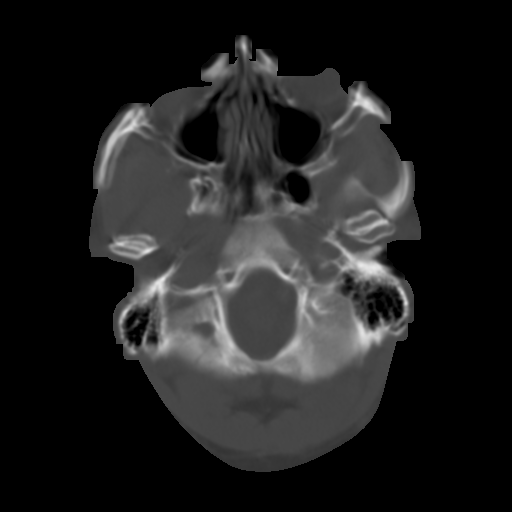
[im 6/34  brain]
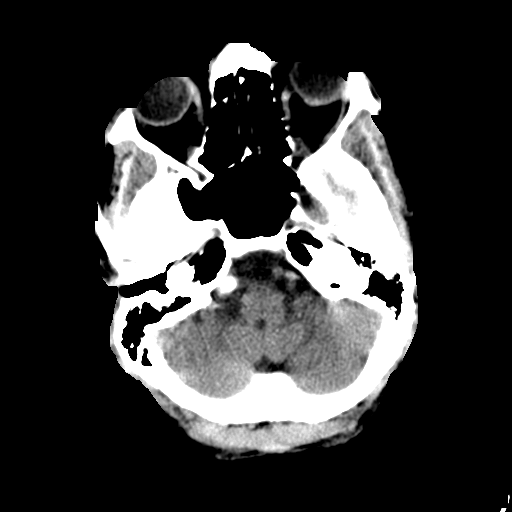
[im 10/34  brain]
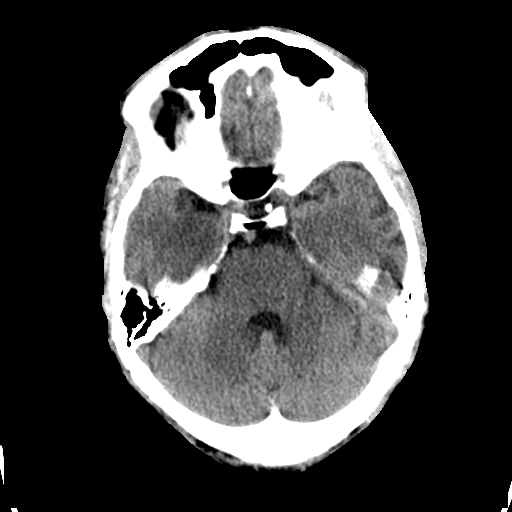
[im 12/34  brain]
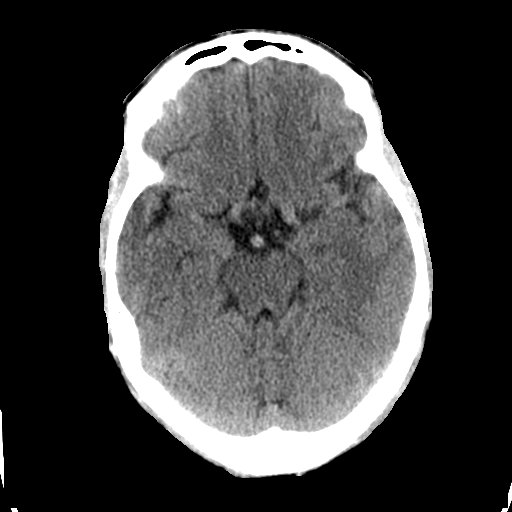
[im 15/34  brain]
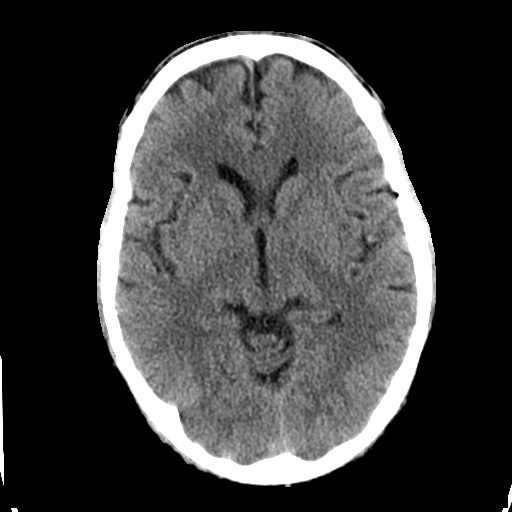
[im 15/34  bone]
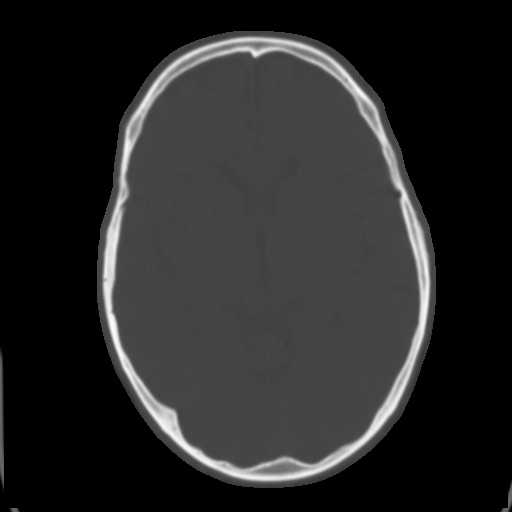
[im 19/34  brain]
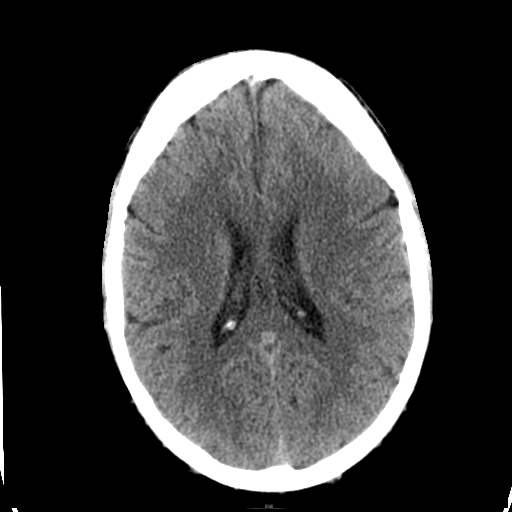
[im 22/34  brain]
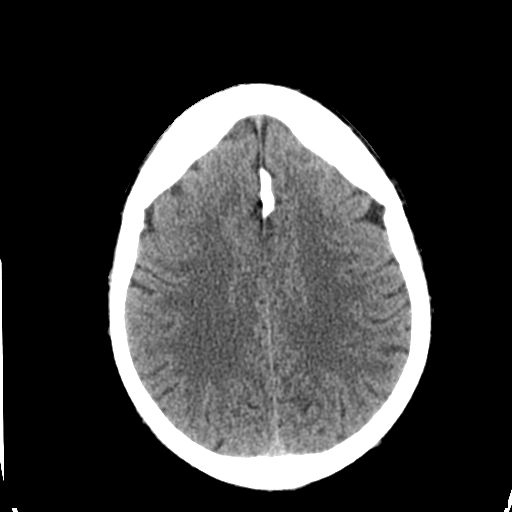
[im 26/34  brain]
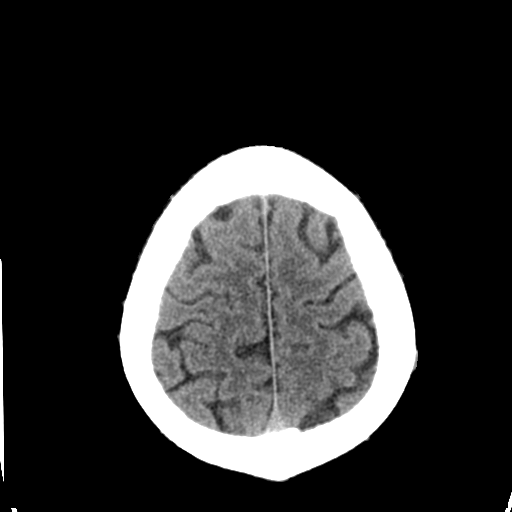
[im 28/34  brain]
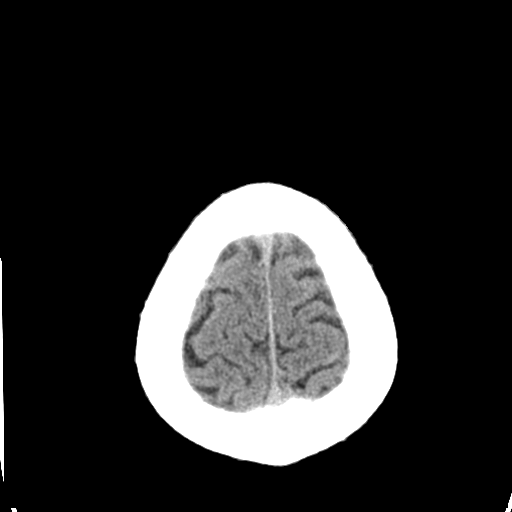
[im 28/34  bone]
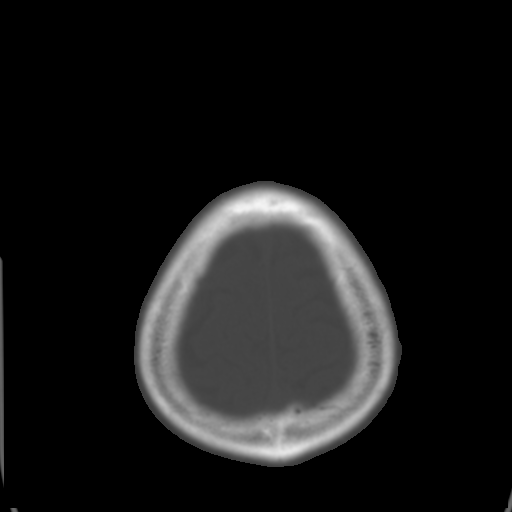
[im 31/34  brain]
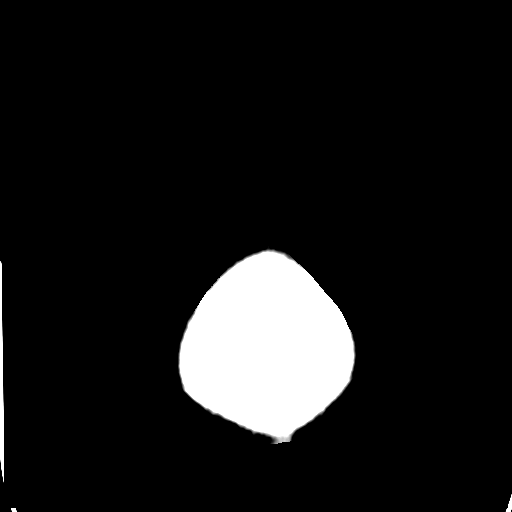

[Series 6: coronal soft tissue · coronal · 0.33mm/px · 3 of 68 slices shown]
[im 23/68  brain]
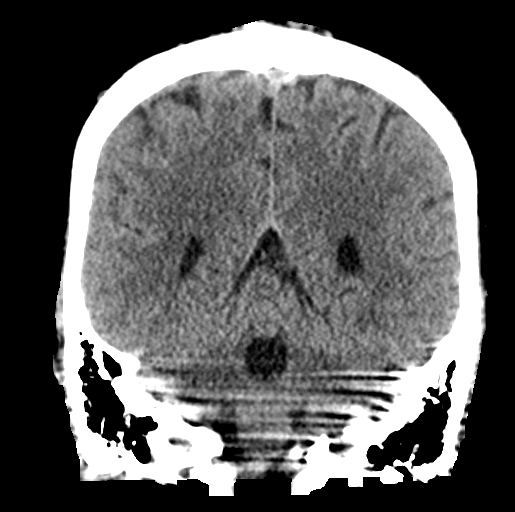
[im 30/68  brain]
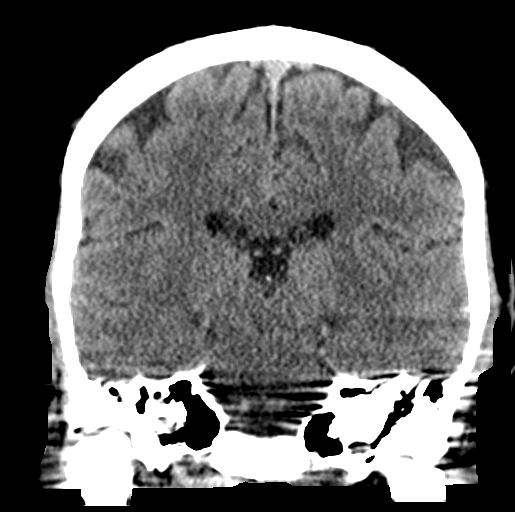
[im 38/68  brain]
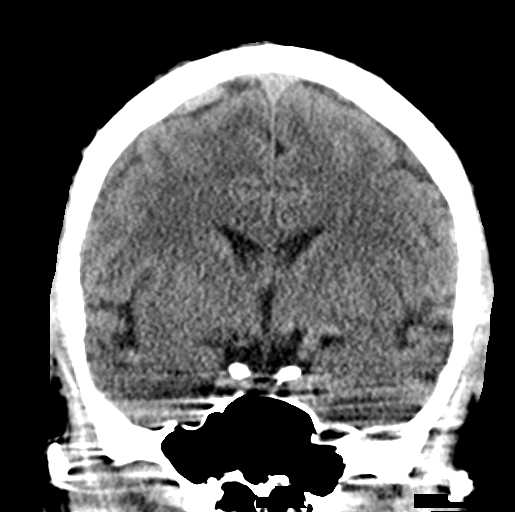

[Series 7: sagittal soft tissue · sagittal · 0.33mm/px · 3 of 57 slices shown]
[im 19/57  brain]
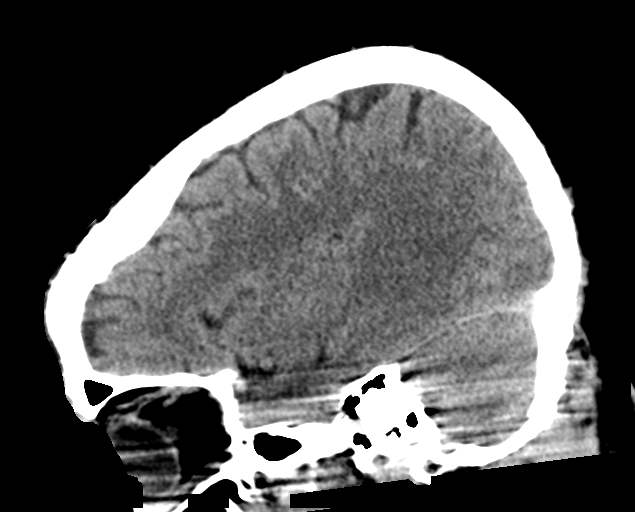
[im 29/57  brain]
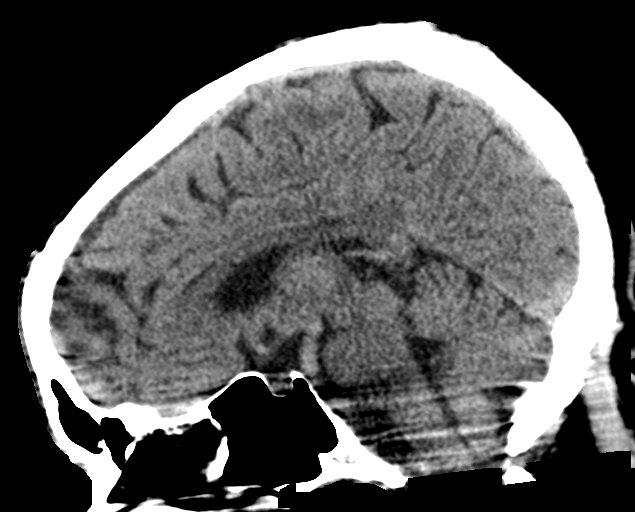
[im 38/57  brain]
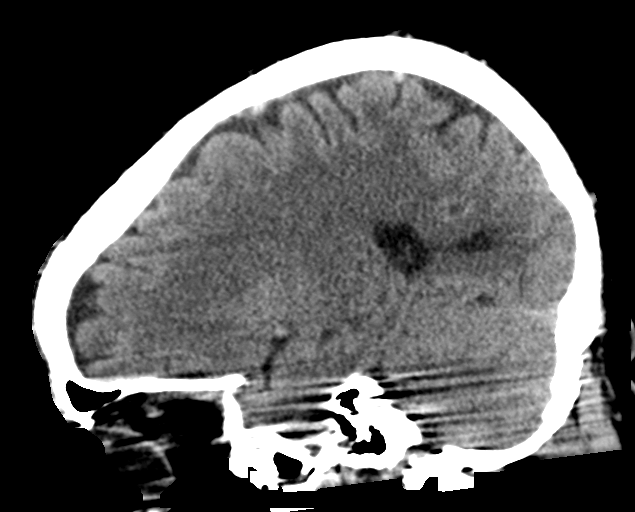

[16 of 47 positions shown; findings below may reference images not displayed]

FINDINGS: Brain: Motion artifact at the skull base. No midline shift,
ventriculomegaly, mass effect, evidence of mass lesion, intracranial
hemorrhage or evidence of cortically based acute infarction.
Gray-white matter differentiation is within normal limits throughout
the brain.

Vascular: No suspicious intracranial vascular hyperdensity. Mild
intracranial artery tortuosity.

Skull: Motion artifact at the skull base. No acute osseous
abnormality identified.

Sinuses/Orbits: Visualized paranasal sinuses and mastoids are clear.

Other: No acute orbit or scalp soft tissue findings.
IMPRESSION: Negative non contrast appearance of the brain. Mild motion artifact
at the skull base.

## 2023-10-21 DEATH — deceased
